# Patient Record
Sex: Female | Born: 1965 | Race: White | Hispanic: No | Marital: Married | Smoking: Never smoker
Health system: Southern US, Community
[De-identification: ages and names within clinical notes are randomized; demographics above are authoritative.]

## PROBLEM LIST (undated history)

## (undated) DIAGNOSIS — F329 Major depressive disorder, single episode, unspecified: Secondary | ICD-10-CM

## (undated) DIAGNOSIS — T8859XA Other complications of anesthesia, initial encounter: Secondary | ICD-10-CM

## (undated) DIAGNOSIS — Z1239 Encounter for other screening for malignant neoplasm of breast: Secondary | ICD-10-CM

## (undated) DIAGNOSIS — N6019 Diffuse cystic mastopathy of unspecified breast: Secondary | ICD-10-CM

## (undated) DIAGNOSIS — E669 Obesity, unspecified: Secondary | ICD-10-CM

## (undated) DIAGNOSIS — Z9889 Other specified postprocedural states: Secondary | ICD-10-CM

## (undated) DIAGNOSIS — R112 Nausea with vomiting, unspecified: Secondary | ICD-10-CM

## (undated) DIAGNOSIS — K8020 Calculus of gallbladder without cholecystitis without obstruction: Secondary | ICD-10-CM

## (undated) DIAGNOSIS — T4145XA Adverse effect of unspecified anesthetic, initial encounter: Secondary | ICD-10-CM

## (undated) DIAGNOSIS — Z1389 Encounter for screening for other disorder: Secondary | ICD-10-CM

## (undated) HISTORY — DX: Encounter for other screening for malignant neoplasm of breast: Z12.39

## (undated) HISTORY — PX: BREAST BIOPSY: SHX20

## (undated) HISTORY — DX: Diffuse cystic mastopathy of unspecified breast: N60.19

## (undated) HISTORY — DX: Obesity, unspecified: E66.9

## (undated) HISTORY — DX: Encounter for screening for other disorder: Z13.89

## (undated) HISTORY — PX: BREAST MASS EXCISION: SHX1267

## (undated) HISTORY — DX: Major depressive disorder, single episode, unspecified: F32.9

## (undated) HISTORY — DX: Calculus of gallbladder without cholecystitis without obstruction: K80.20

---

## 1999-05-19 HISTORY — PX: TUBAL LIGATION: SHX77

## 2003-05-19 DIAGNOSIS — F32A Depression, unspecified: Secondary | ICD-10-CM

## 2003-05-19 HISTORY — DX: Depression, unspecified: F32.A

## 2004-12-16 ENCOUNTER — Ambulatory Visit: Payer: Self-pay | Admitting: General Surgery

## 2005-01-06 ENCOUNTER — Ambulatory Visit: Payer: Self-pay | Admitting: General Surgery

## 2005-04-20 ENCOUNTER — Ambulatory Visit: Payer: Self-pay | Admitting: General Surgery

## 2005-05-18 HISTORY — PX: BREAST MASS EXCISION: SHX1267

## 2005-05-29 ENCOUNTER — Ambulatory Visit: Payer: Self-pay | Admitting: General Surgery

## 2005-12-21 ENCOUNTER — Ambulatory Visit: Payer: Self-pay | Admitting: General Surgery

## 2007-05-30 ENCOUNTER — Ambulatory Visit: Payer: Self-pay | Admitting: General Surgery

## 2008-05-31 ENCOUNTER — Ambulatory Visit: Payer: Self-pay | Admitting: General Surgery

## 2009-07-31 ENCOUNTER — Ambulatory Visit: Payer: Self-pay | Admitting: General Surgery

## 2010-08-05 ENCOUNTER — Ambulatory Visit: Payer: Self-pay | Admitting: General Surgery

## 2011-09-02 ENCOUNTER — Ambulatory Visit: Payer: Self-pay | Admitting: General Surgery

## 2011-09-14 ENCOUNTER — Ambulatory Visit: Payer: Self-pay | Admitting: General Surgery

## 2012-02-17 ENCOUNTER — Ambulatory Visit: Payer: Self-pay | Admitting: Unknown Physician Specialty

## 2012-03-17 ENCOUNTER — Ambulatory Visit: Payer: Self-pay | Admitting: General Surgery

## 2012-05-18 HISTORY — PX: CHOLECYSTECTOMY: SHX55

## 2012-07-08 ENCOUNTER — Encounter: Payer: Self-pay | Admitting: General Surgery

## 2012-08-12 ENCOUNTER — Other Ambulatory Visit: Payer: Self-pay | Admitting: General Surgery

## 2012-08-12 ENCOUNTER — Ambulatory Visit (INDEPENDENT_AMBULATORY_CARE_PROVIDER_SITE_OTHER): Payer: No Typology Code available for payment source | Admitting: General Surgery

## 2012-08-12 ENCOUNTER — Encounter: Payer: Self-pay | Admitting: General Surgery

## 2012-08-12 ENCOUNTER — Ambulatory Visit: Payer: Self-pay | Admitting: Unknown Physician Specialty

## 2012-08-12 DIAGNOSIS — R945 Abnormal results of liver function studies: Secondary | ICD-10-CM

## 2012-08-12 DIAGNOSIS — R7989 Other specified abnormal findings of blood chemistry: Secondary | ICD-10-CM

## 2012-08-12 DIAGNOSIS — K8020 Calculus of gallbladder without cholecystitis without obstruction: Secondary | ICD-10-CM

## 2012-08-12 HISTORY — DX: Calculus of gallbladder without cholecystitis without obstruction: K80.20

## 2012-08-12 NOTE — Progress Notes (Signed)
Patient ID: Mary Morris, female   DOB: 1965-08-11, 47 y.o.   MRN: [id number]  Chief Complaint  Patient presents with  . Abdominal Pain  . Cholelithiasis    HPI Mary Morris is a 47 y.o. female was seen urgently at the request of Amedeo Kinsman, RN-C. in regards to cholelithiasis and abnormal liver function studies the patient had been noted to have fluctuating liver function studies with transaminases in the 200s as early as 3 years ago. Previous evaluation with ultrasound in October 2013 was negative. The patient has lost 35 pounds by dietary modification and activity changes since that time. She has had episodic pain dating back to December 2013 of increasing frequency. A recent ultrasound showed evidence of cholelithiasis. Her serum transaminases are now in the 500s, and the patient was seen for possible cholecystectomy liver biopsy.  Abdominal Pain This is a recurrent problem. The current episode started more than 1 month ago. The onset quality is sudden. The problem occurs every several days. The problem has been gradually worsening. The pain is located in the epigastric region. The pain is severe. The quality of the pain is colicky and a sensation of fullness. The abdominal pain does not radiate. Associated symptoms include nausea. Pertinent negatives include no anorexia, constipation, diarrhea, fever or vomiting. The pain is aggravated by eating. The pain is relieved by nothing. She has tried antacids for the symptoms. The treatment provided moderate relief. Prior diagnostic workup includes ultrasound. Her past medical history is significant for abdominal surgery. C-section    Past Medical History  Diagnosis Date  . Diffuse cystic mastopathy   . Breast screening, unspecified   . Depression 2005  . Obesity, unspecified   . Screening for obesity   . Gallstone 08-12-12    Past Surgical History  Procedure Laterality Date  . Cesarean section  1998 and 2001  . Breast mass excision Left  around 2008  . Breast mass excision Right 2007  . Tubal ligation  2001    History reviewed. No pertinent family history.  Social History History  Substance Use Topics  . Smoking status: Never Smoker   . Smokeless tobacco: Not on file  . Alcohol Use: No    Allergies no known allergies  No current outpatient prescriptions on file.   No current facility-administered medications for this visit.    Review of Systems Review of Systems  Constitutional: Negative for fever.  HENT: Negative.   Eyes: Negative.   Respiratory: Negative.   Cardiovascular: Negative.   Gastrointestinal: Positive for nausea and abdominal pain. Negative for vomiting, diarrhea, constipation, abdominal distention and anorexia.  Endocrine: Negative.   Genitourinary: Negative.   Hematological: Negative.   Psychiatric/Behavioral: Negative.     There were no vitals taken for this visit.  Physical Exam Physical Exam  Constitutional: She is oriented to person, place, and time. She appears well-developed and well-nourished.  HENT:  Head: Normocephalic.  Neck: Normal range of motion. No thyromegaly present.  Cardiovascular: Normal rate, regular rhythm, normal heart sounds and intact distal pulses.   Pulmonary/Chest: Effort normal and breath sounds normal.  Abdominal: She exhibits no distension and no mass. There is no tenderness. There is no rebound and no guarding.  Musculoskeletal: Normal range of motion.  Lymphadenopathy:    She has no cervical adenopathy.  Neurological: She is alert and oriented to person, place, and time. She has normal reflexes.  Skin: Skin is warm.  Psychiatric: She has a normal mood and affect. Her behavior is normal. Judgment  and thought content normal.    Data Reviewed Abdominal ultrasound completed 08/11/2012 showed numerous gallstones. No gallbladder wall thickening, pericholecystic fluid or sonographic Murphy sign. Normal common bile duct.  HIDA scan today showed  nonvisualization of the gallbladder consistent with chronic cholecystitis.  Liver function studies earlier this week showed a rise in her transaminases from the previous baseline of 202 the near 500 level. Normal bilirubin. Normal total protein. Normal albumin. CBC normal.  Assessment    Elevated liver enzymes, symptomatic cholelithiasis.    Plan    Cholecystectomy with liver biopsy is scheduled for 08/15/2012. The patient was given a prescription for Ultram 50 mg tablets, #40 with the inscription 1-2 by mouth every 4 hours when necessary for pain as well as Phenergan 25 mg tablets, #20 with the inscription one by mouth every 4 hours when necessary for nausea. She was instructed to make use of these at the first sensation of her recurrent bout of upper abdominal pain.  The risks and benefits of elective surgery were reviewed including those related to bleeding, infection and injury to the biliary tree.  I anticipate she'll be able to return to work in 7-10 days post procedure.       [name] 08/12/2012, 3:22 PM

## 2012-08-15 ENCOUNTER — Encounter: Payer: Self-pay | Admitting: *Deleted

## 2012-08-15 ENCOUNTER — Ambulatory Visit: Payer: Self-pay | Admitting: General Surgery

## 2012-08-15 DIAGNOSIS — R748 Abnormal levels of other serum enzymes: Secondary | ICD-10-CM

## 2012-08-15 DIAGNOSIS — K8010 Calculus of gallbladder with chronic cholecystitis without obstruction: Secondary | ICD-10-CM

## 2012-08-16 ENCOUNTER — Encounter: Payer: Self-pay | Admitting: General Surgery

## 2012-08-17 LAB — PATHOLOGY REPORT

## 2012-08-18 ENCOUNTER — Telehealth: Payer: Self-pay | Admitting: *Deleted

## 2012-08-18 ENCOUNTER — Encounter: Payer: Self-pay | Admitting: General Surgery

## 2012-08-18 NOTE — Telephone Encounter (Signed)
Message copied by Currie Paris on Thu Aug 18, 2012  8:13 AM ------      Message from: [place], [place] W      Created: Wed Aug 17, 2012  9:44 PM       [name]:Please notify the patient that the liver biopsy pathology showed a resolving inflammatory process without any evidence of malignancy or fat deposition. A copy will be forwarded to Dr. Earnest Conroy office. We'll review in detail at her postoperative visit.      [name]: please send a copy of the August 15, 2012 pathology report to Dr. Earnest Conroy office. Thank you ------

## 2012-08-18 NOTE — Progress Notes (Signed)
Quick Note:  The pathology report has been reviewed. The liver biopsy shows evidence of a resolving hepatitic process. No evidence of fatty infiltration. No evidence of cholestasis or steatosis. No evidence of portal or periductal fibrosis. No stable iron. Differential diagnoses of nonspecific findings suggest adverse drug reaction (? Herbals) intra-abdominal inflammatory process or early resolving hepatobiliary process. The gallbladder showed evidence of chronic cholecystitis and cholelithiasis. The patient has been notified by the nurse of the results. ______

## 2012-08-18 NOTE — Telephone Encounter (Signed)
Notified pt as instructed

## 2012-08-24 ENCOUNTER — Encounter: Payer: Self-pay | Admitting: General Surgery

## 2012-08-24 ENCOUNTER — Ambulatory Visit (INDEPENDENT_AMBULATORY_CARE_PROVIDER_SITE_OTHER): Payer: No Typology Code available for payment source | Admitting: General Surgery

## 2012-08-24 VITALS — BP 120/78 | HR 74 | Resp 14 | Ht 59.0 in | Wt 143.0 lb

## 2012-08-24 DIAGNOSIS — K8020 Calculus of gallbladder without cholecystitis without obstruction: Secondary | ICD-10-CM

## 2012-08-24 NOTE — Patient Instructions (Addendum)
Patient to return as needed. 

## 2012-08-24 NOTE — Progress Notes (Signed)
  Patient ID: Mary Morris, female   DOB: 27-Jan-1966, 47 y.o.   MRN: [id number]  Chief Complaint  Patient presents with  . Routine Post Op    gallbladder    HPI [name] is a 47 y.o. female here for her post op gallbladder. Patient states no new problems.  The patient underwent cholecystectomy and liver biopsy on 08/15/2012. She had been noted to have rising liver function studies as well as abdominal pain hardest surgery. The latter has resolved completely HPI  Past Medical History  Diagnosis Date  . Diffuse cystic mastopathy   . Breast screening, unspecified   . Depression 2005  . Obesity, unspecified   . Screening for obesity   . Gallstone 08-12-12    Past Surgical History  Procedure Laterality Date  . Cesarean section  1998 and 2001  . Breast mass excision Left around 2008  . Breast mass excision Right 2007  . Tubal ligation  2001  . Cholecystectomy  2014    No family history on file.  Social History History  Substance Use Topics  . Smoking status: Never Smoker   . Smokeless tobacco: Not on file  . Alcohol Use: No    No Known Allergies  No current outpatient prescriptions on file.   No current facility-administered medications for this visit.    Review of Systems Review of Systems  Constitutional: Negative.   Respiratory: Negative.   Cardiovascular: Negative.   Gastrointestinal: Negative.     Blood pressure 120/78, pulse 74, resp. rate 14, height 4\' 11"  (1.499 m), weight 143 lb (64.864 kg), last menstrual period 08/15/2012.  Physical Exam Physical Exam  Constitutional: She appears well-developed and well-nourished.  Neck: Normal range of motion. Neck supple.  Cardiovascular: Normal rate, regular rhythm and normal heart sounds.   Pulmonary/Chest: Effort normal and breath sounds normal.  Abdominal: Soft. Bowel sounds are normal.    Data Reviewed Pathology showed chronic cholecystitis and cholelithiasis.  Liver biopsy showed evidence of a  resolving hepatitic process. There was no cholestasis or steatosis. No further significant inflammation or evidence of bile duct damage was identified. The findings were felt consistent with a resolving process.  The patient denies use of any herbal medications or nonprescription drugs during her period of weight loss.  Assessment    Doing well post cholecystectomy. Resolution of previous abdominal pain.    Plan    The patient is released to full activity. Follow up on an as-needed basis.  The patient is scheduled to followup with the GI department tomorrow. Repeat laboratory studies will be scheduled through their office.       [name] 08/25/2012, 7:30 PM

## 2012-08-25 ENCOUNTER — Encounter: Payer: Self-pay | Admitting: General Surgery

## 2012-09-21 ENCOUNTER — Encounter: Payer: Self-pay | Admitting: General Surgery

## 2012-09-26 ENCOUNTER — Ambulatory Visit: Payer: Self-pay | Admitting: General Surgery

## 2012-09-28 ENCOUNTER — Ambulatory Visit: Payer: Self-pay | Admitting: General Surgery

## 2012-09-29 ENCOUNTER — Encounter: Payer: Self-pay | Admitting: General Surgery

## 2012-10-06 ENCOUNTER — Encounter: Payer: Self-pay | Admitting: General Surgery

## 2012-10-06 ENCOUNTER — Ambulatory Visit (INDEPENDENT_AMBULATORY_CARE_PROVIDER_SITE_OTHER): Payer: No Typology Code available for payment source | Admitting: General Surgery

## 2012-10-06 ENCOUNTER — Other Ambulatory Visit: Payer: Self-pay | Admitting: *Deleted

## 2012-10-06 VITALS — BP 124/72 | HR 74 | Resp 12 | Ht 59.0 in | Wt 144.0 lb

## 2012-10-06 DIAGNOSIS — Z1239 Encounter for other screening for malignant neoplasm of breast: Secondary | ICD-10-CM

## 2012-10-06 DIAGNOSIS — Z1231 Encounter for screening mammogram for malignant neoplasm of breast: Secondary | ICD-10-CM

## 2012-10-06 DIAGNOSIS — N6019 Diffuse cystic mastopathy of unspecified breast: Secondary | ICD-10-CM

## 2012-10-06 NOTE — Progress Notes (Signed)
Patient will be asked to return to the office in one year for a bilateral screening mammogram. 

## 2012-10-06 NOTE — Patient Instructions (Addendum)
To follow up in 1 year with bilateral screening mammogram. Advised again on self exam.

## 2012-10-06 NOTE — Progress Notes (Signed)
Patient ID: Mary Morris, female   DOB: 10/27/1965, 47 y.o.   MRN: [id number]  Chief Complaint  Patient presents with  . Follow-up    mammogram    HPI Mary Morris is a 47 y.o. female here today following up from her mammogram done on 09/28/12 with birad category 1. Patient does not perform self breast checks but gets regular mammograms. The patient has had bilateral breast mass excisions performed in the past. No known family history of breast problems. No complaints at this time. Since last seen 1 yr ago she has had lap/chole and has done well since. HPI  Past Medical History  Diagnosis Date  . Diffuse cystic mastopathy   . Breast screening, unspecified   . Depression 2005  . Obesity, unspecified   . Screening for obesity   . Gallstone 08-12-12    Past Surgical History  Procedure Laterality Date  . Cesarean section  1998 and 2001  . Breast mass excision Left around 2008  . Breast mass excision Right 2007  . Tubal ligation  2001  . Cholecystectomy  2014    History reviewed. No pertinent family history.  Social History History  Substance Use Topics  . Smoking status: Never Smoker   . Smokeless tobacco: Never Used  . Alcohol Use: No    No Known Allergies  No current outpatient prescriptions on file.   No current facility-administered medications for this visit.    Review of Systems Review of Systems  Constitutional: Negative.   Respiratory: Negative.   Cardiovascular: Negative.     Blood pressure 124/72, pulse 74, resp. rate 12, height 4\' 11"  (1.499 m), weight 144 lb (65.318 kg), last menstrual period 09/22/2012.  Physical Exam Physical Exam  Constitutional: She appears well-developed and well-nourished.  Eyes: Conjunctivae are normal. No scleral icterus.  Neck: No mass and no thyromegaly present.  Cardiovascular: Normal rate, regular rhythm, normal heart sounds and normal pulses.   No murmur heard. Pulmonary/Chest: Effort normal and breath sounds normal.  Right breast exhibits no inverted nipple, no mass, no nipple discharge, no skin change and no tenderness. Left breast exhibits no inverted nipple, no mass, no nipple discharge, no skin change and no tenderness. Breasts are symmetrical.  Lymphadenopathy:    She has no cervical adenopathy.    She has no axillary adenopathy.    Data Reviewed Mammogram reviewed with birad 1.   Assessments    Exam stable.      Plan    1 year follow up bilateral screening mammogram.         Clancey Welton G 10/06/2012, 8:04 PM

## 2013-07-20 ENCOUNTER — Ambulatory Visit: Payer: Self-pay

## 2013-10-03 ENCOUNTER — Ambulatory Visit: Payer: Self-pay | Admitting: General Surgery

## 2013-10-04 ENCOUNTER — Encounter: Payer: Self-pay | Admitting: General Surgery

## 2013-10-10 ENCOUNTER — Ambulatory Visit (INDEPENDENT_AMBULATORY_CARE_PROVIDER_SITE_OTHER): Payer: No Typology Code available for payment source | Admitting: General Surgery

## 2013-10-10 ENCOUNTER — Encounter: Payer: Self-pay | Admitting: General Surgery

## 2013-10-10 VITALS — BP 120/74 | HR 76 | Resp 12 | Ht 59.0 in | Wt 169.0 lb

## 2013-10-10 DIAGNOSIS — Z1231 Encounter for screening mammogram for malignant neoplasm of breast: Secondary | ICD-10-CM

## 2013-10-10 NOTE — Progress Notes (Signed)
Patient ID: Mary Morris, female   DOB: 02/16/1966, 48 y.o.   MRN: [id number]  Chief Complaint  Patient presents with  . Follow-up    mammogram    HPI Mary Morris is a 48 y.o. female who presents for a breast evaluation. The most recent mammogram was done on 10/03/13.  Patient does perform regular self breast checks and gets regular mammograms done.  No complaints.  HPI  Past Medical History  Diagnosis Date  . Diffuse cystic mastopathy   . Breast screening, unspecified   . Depression 2005  . Obesity, unspecified   . Screening for obesity   . Gallstone 08-12-12    Past Surgical History  Procedure Laterality Date  . Cesarean section  1998 and 2001  . Breast mass excision Left around 2008  . Breast mass excision Right 2007  . Tubal ligation  2001  . Cholecystectomy  2014    History reviewed. No pertinent family history.  Social History History  Substance Use Topics  . Smoking status: Never Smoker   . Smokeless tobacco: Never Used  . Alcohol Use: No    No Known Allergies  No current outpatient prescriptions on file.   No current facility-administered medications for this visit.    Review of Systems Review of Systems  Constitutional: Negative.   Respiratory: Negative.   Cardiovascular: Negative.     Blood pressure 120/74, pulse 76, resp. rate 12, height 4\' 11"  (1.499 m), weight 169 lb (76.658 kg), last menstrual period 09/26/2013.  Physical Exam Physical Exam  Constitutional: She is oriented to person, place, and time. She appears well-developed and well-nourished.  Eyes: Conjunctivae are normal. No scleral icterus.  Neck: Neck supple. No mass and no thyromegaly present.  Cardiovascular: Normal rate, regular rhythm and normal heart sounds.   Pulmonary/Chest: Effort normal and breath sounds normal. Right breast exhibits no inverted nipple, no mass, no nipple discharge, no skin change and no tenderness. Left breast exhibits no inverted nipple, no mass, no nipple  discharge, no skin change and no tenderness.   Breast   well healed incisions..   Abdominal: Soft. Bowel sounds are normal. There is no tenderness.  Lymphadenopathy:    She has no cervical adenopathy.    She has no axillary adenopathy.  Neurological: She is oriented to person, place, and time.  Skin: Skin is warm and dry.    Data Reviewed Mammogram reviewed  Assessment    Stable exam. History of FCD    Plan    Patient will be asked to return to the office in one year with a bilateral screening mammogram.       Tiffney Haughton G Yi Haugan 10/11/2013, 6:24 AM

## 2013-10-10 NOTE — Patient Instructions (Addendum)
Patient will be asked to return to the office in one year with a bilateral screening mammogram.  Continue self breast exams. Call office for any new breast issues or concerns.  

## 2013-10-11 ENCOUNTER — Encounter: Payer: Self-pay | Admitting: General Surgery

## 2014-03-19 ENCOUNTER — Encounter: Payer: Self-pay | Admitting: General Surgery

## 2014-03-29 IMAGING — NM NUCLEAR MEDICINE HEPATOHBILIARY INCLUDE GB
1 series · 10 of 10 positions shown · non-contrast
Comparison: none

REASON FOR EXAM: epigastric abd pain elev alkaline phosphatase elev
transaminase If neg do Hi...
COMMENTS:  Change per Dr. Hyrum @ [DATE] ..... gave verbal order ....
change due to patient having gallstones

[Series 1000: gallbladder statics · 4.80mm/px · 10 of 10 slices shown]
[im 1/10]
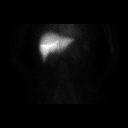
[im 2/10]
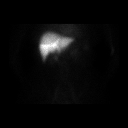
[im 3/10]
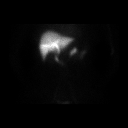
[im 4/10]
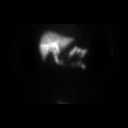
[im 5/10]
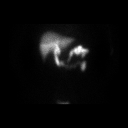
[im 6/10]
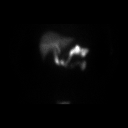
[im 7/10]
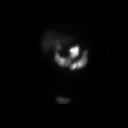
[im 8/10]
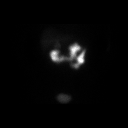
[im 9/10]
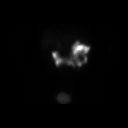
[im 10/10]
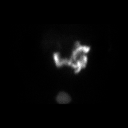

[10 of 10 positions shown; findings below may reference images not displayed]

PROCEDURE:     NM  - NM HEPATOBILIARY IMAGE  - August 12, 2012 [DATE]

RESULT:     The patient received a dose of 7.73 mCi of technetium 99m
Choletec. There is prompt extraction of tracer from the blood pool by the
liver with common bile duct activity seen between 5 and 10 minutes with
small bowel activity well seen by 10 minutes with clearance of activity from
the liver by 60 minutes. No gallbladder localization is evident. The
appearance is concerning for acute cholecystitis or cystic duct obstruction
possibly with chronic cholecystitis. Given the extensive cholelithiasis and
sludge within the gallbladder surgical consultation is recommended. The
patient had a reported negative sonographic Murphy's sign by the
technologist without pericholecystic fluid or definite abnormal gallbladder
wall thickening but given the hepatobiliary findings surgical consultation
is suggested.
IMPRESSION: Nonvisualization of the gallbladder concern for acute
cholecystitis, cystic duct obstruction or possibly chronic cholecystitis.

[REDACTED]

## 2014-03-29 IMAGING — US ABDOMEN ULTRASOUND
1 series · 14 of 25 positions shown · non-contrast
Comparison: none

REASON FOR EXAM: epigastric abd pain elev alkaline phosphatase elev
transaminase If neg do Hi...
COMMENTS:

[Series 2001: abdomen ultrasound · 0.20mm/px · 14 of 96 slices shown]
[im 1/96]
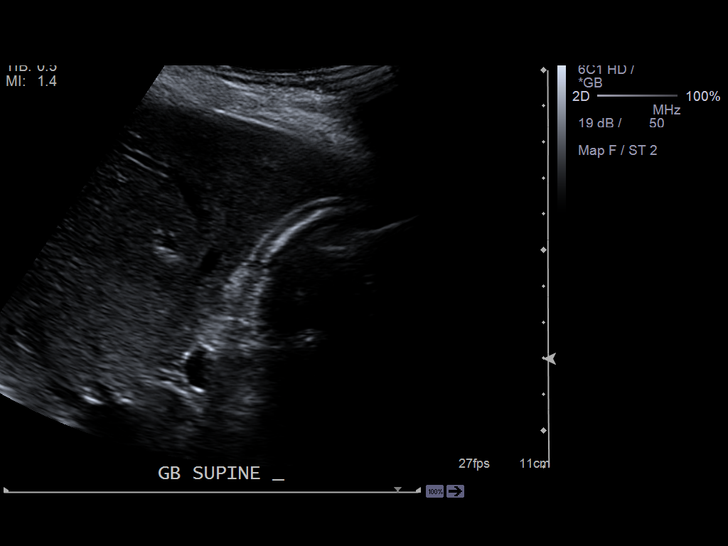
[im 8/96]
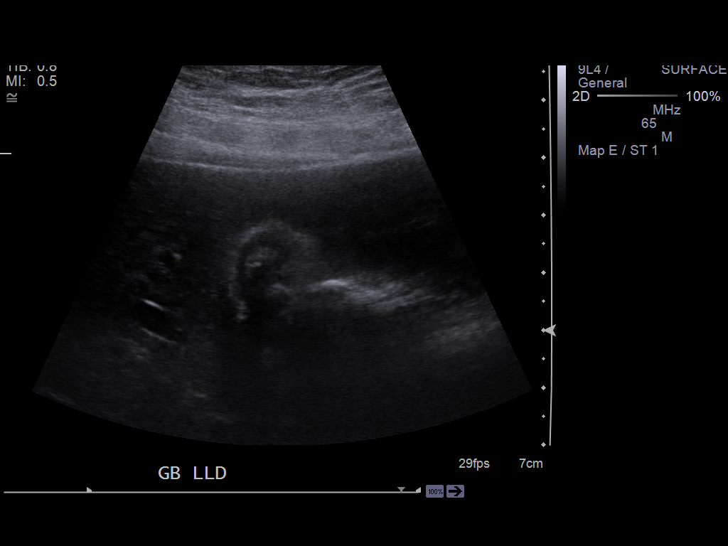
[im 16/96]
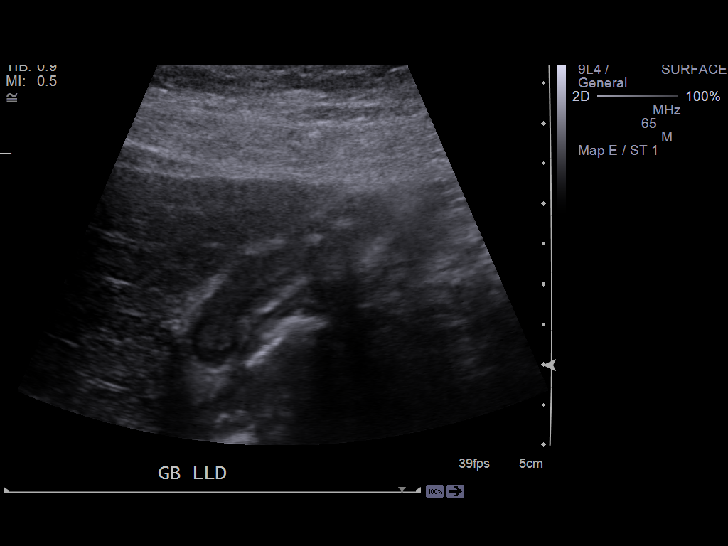
[im 24/96]
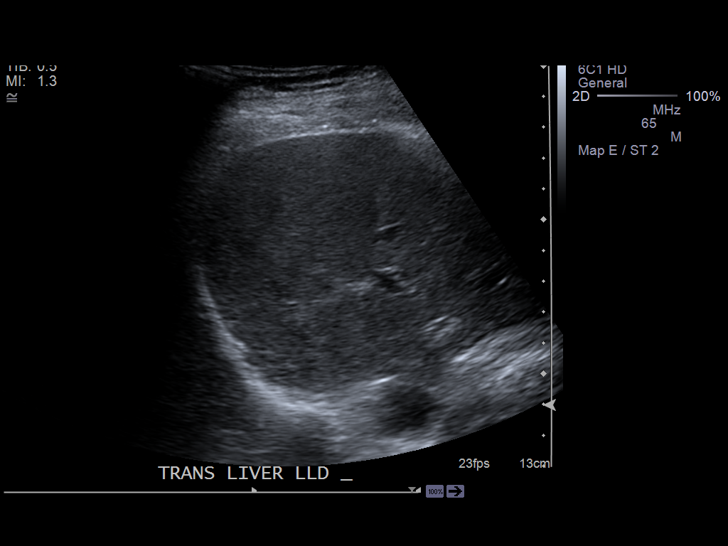
[im 32/96]
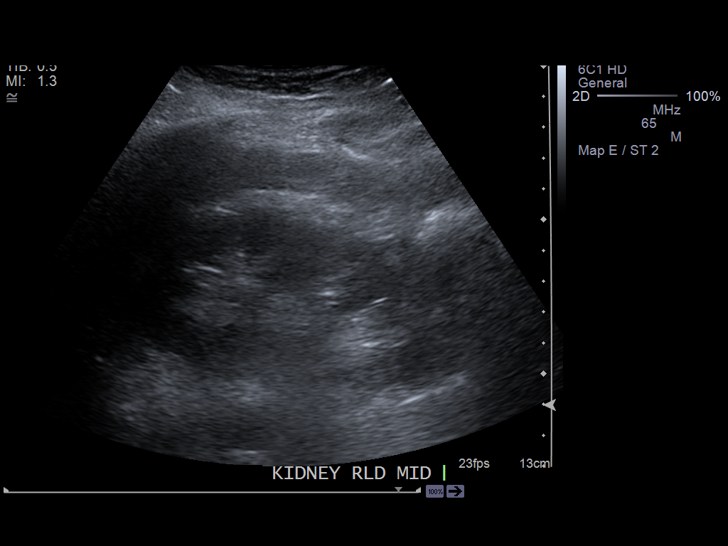
[im 36/96]
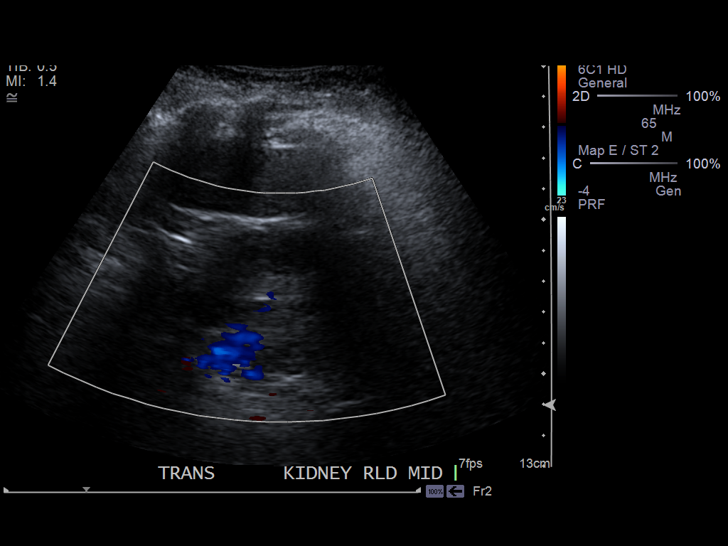
[im 44/96]
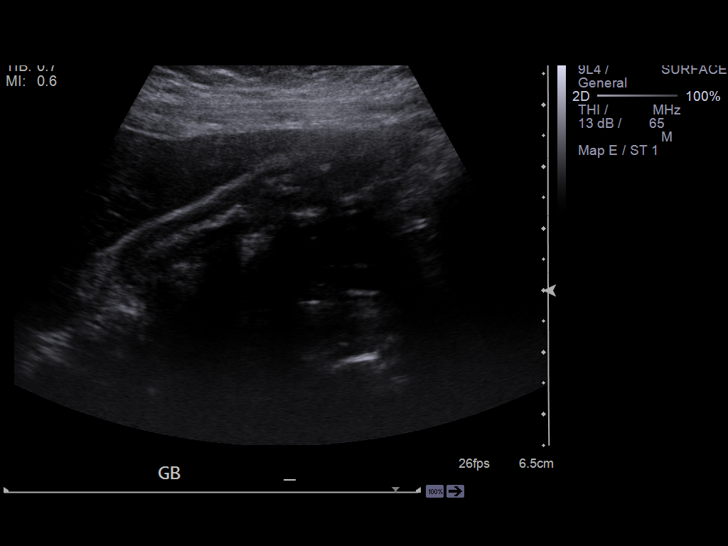
[im 52/96]
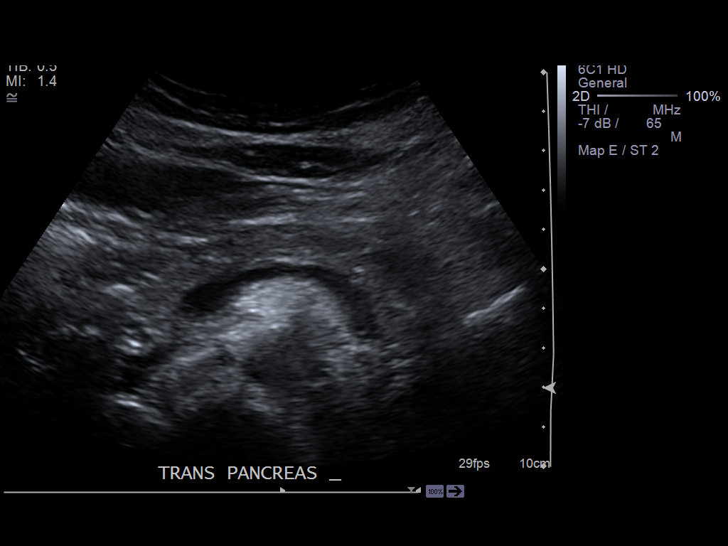
[im 60/96]
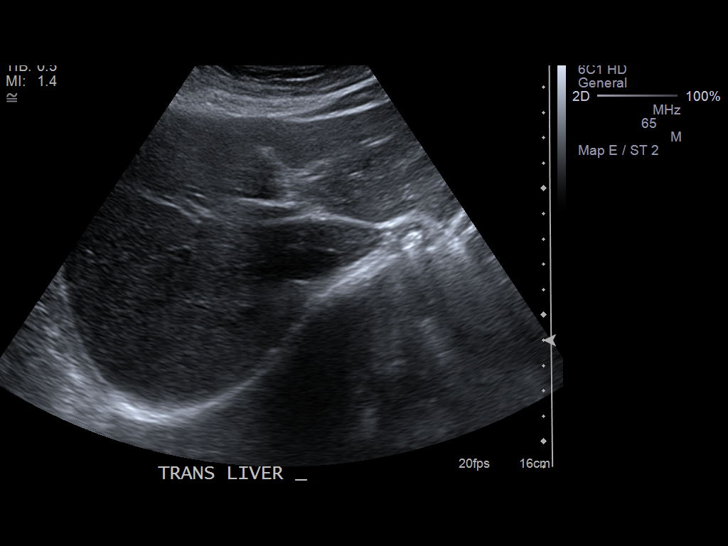
[im 64/96]
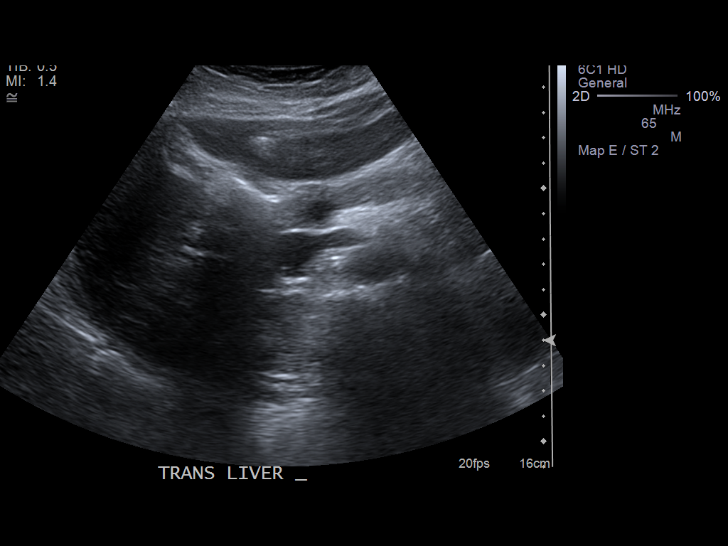
[im 72/96]
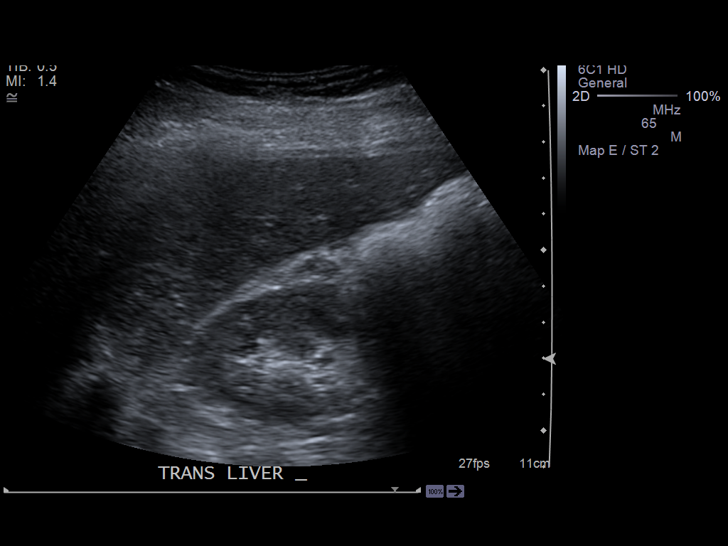
[im 80/96]
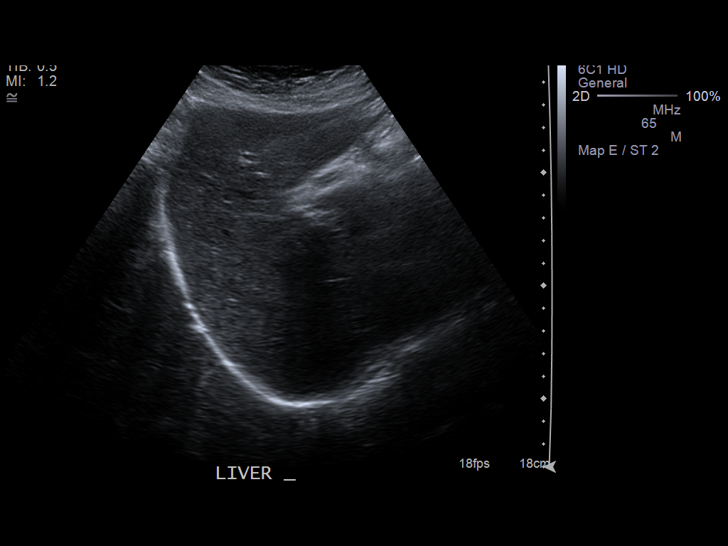
[im 88/96]
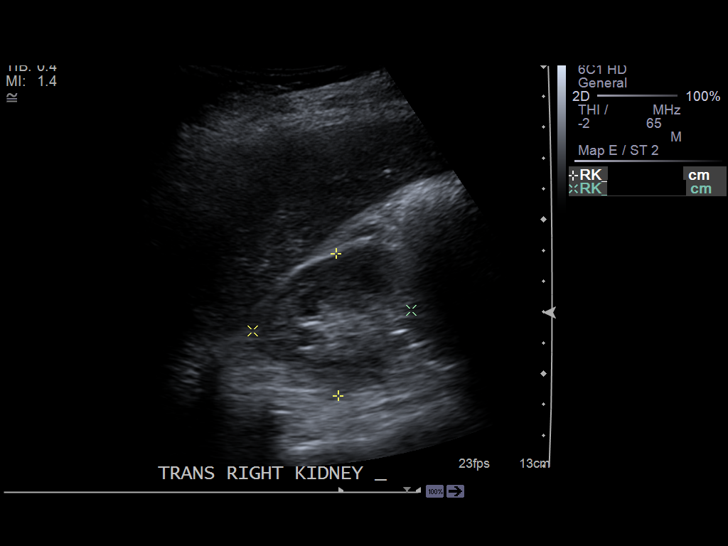
[im 96/96]
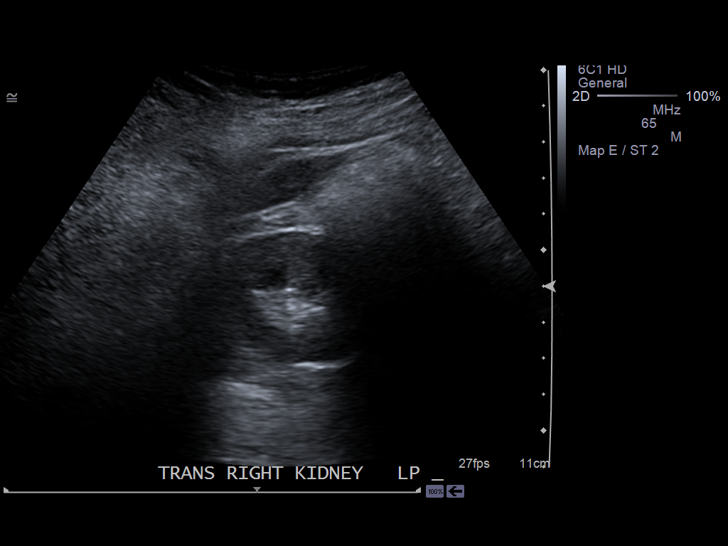

[14 of 25 positions shown; findings below may reference images not displayed]

PROCEDURE:     US  - US ABDOMEN GENERAL SURVEY  - August 12, 2012 [DATE]

RESULT:     Abdominal sonogram is performed in the standard fashion. The
patient has a previous abdominal ultrasound dated to February 2012. There
appears to be with filling of gallbladder with sludge and stones. The stones
appear to be nonmobile. There is a negative sonographic Murphy's sign.
Gallbladder wall thickness is 1.4 mm. Common bile duct diameter 3.6 mm.
Liver length is 12.59 cm with a normal hepatic echotexture. The pancreas and
spleen appear grossly normal. Length of the spleen is 9.56 cm. Proximal
inferior vena cava and the visualized abdominal aorta appear normal. The
right kidney measures 11.14 x 4.61 x 5.19 cm. The left kidney measures
x 5.44 x 5.19 cm. Neither kidney shows obstruction or mass. No focal
pancreatic mass or ductal dilation is evident in the visualized portions.
IMPRESSION: Sludge and stones in the gallbladder without sonographic
evidence of acute cholecystitis. Otherwise, unremarkable abdominal sonogram.

[REDACTED]

## 2014-04-01 IMAGING — XA DG CHOLANGIOGRAM OPERATIVE
2 series · 3 of 3 positions shown · non-contrast
Comparison: none

REASON FOR EXAM: Cholelithiasis
COMMENTS:

[Series 6001: by1 · 1 of 1 slices shown]
[im 1/1]
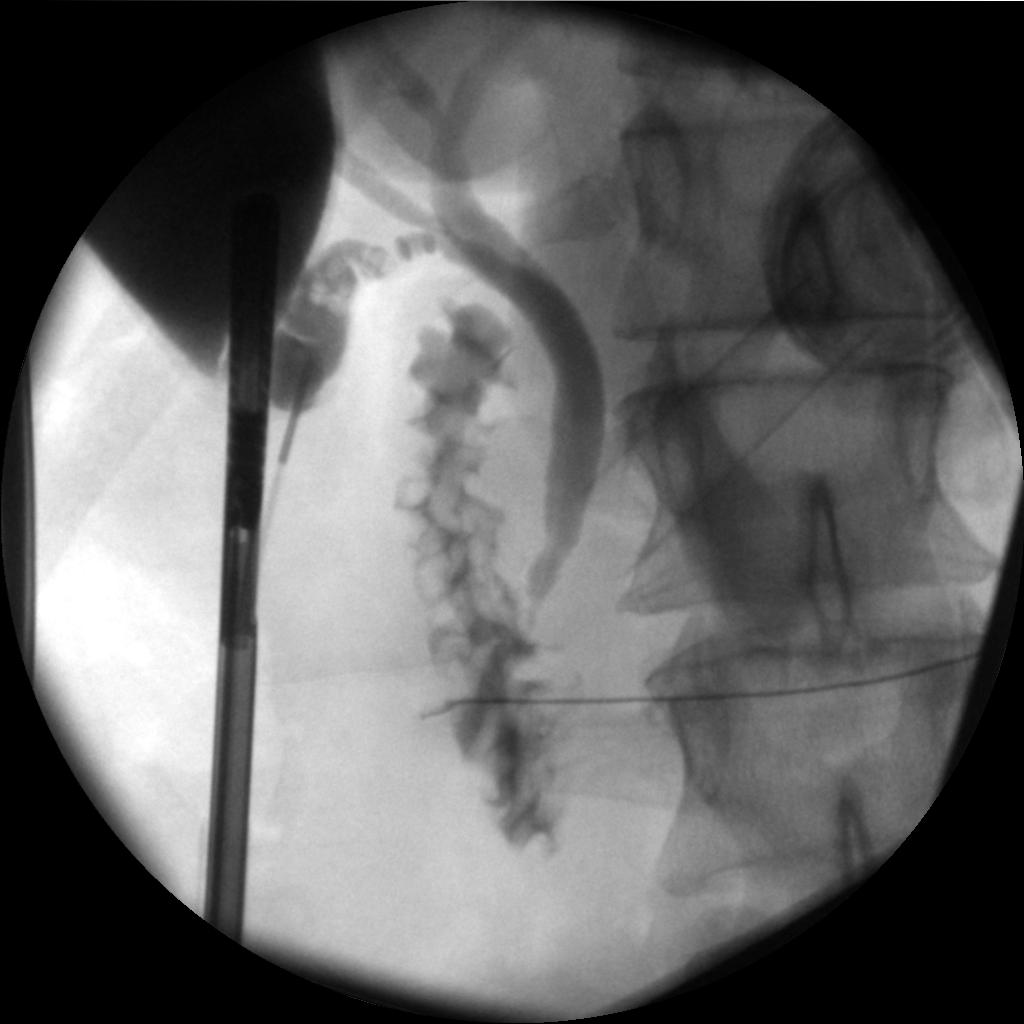

[Series 6002: by2 · 2 of 2 slices shown]
[im 1/2]
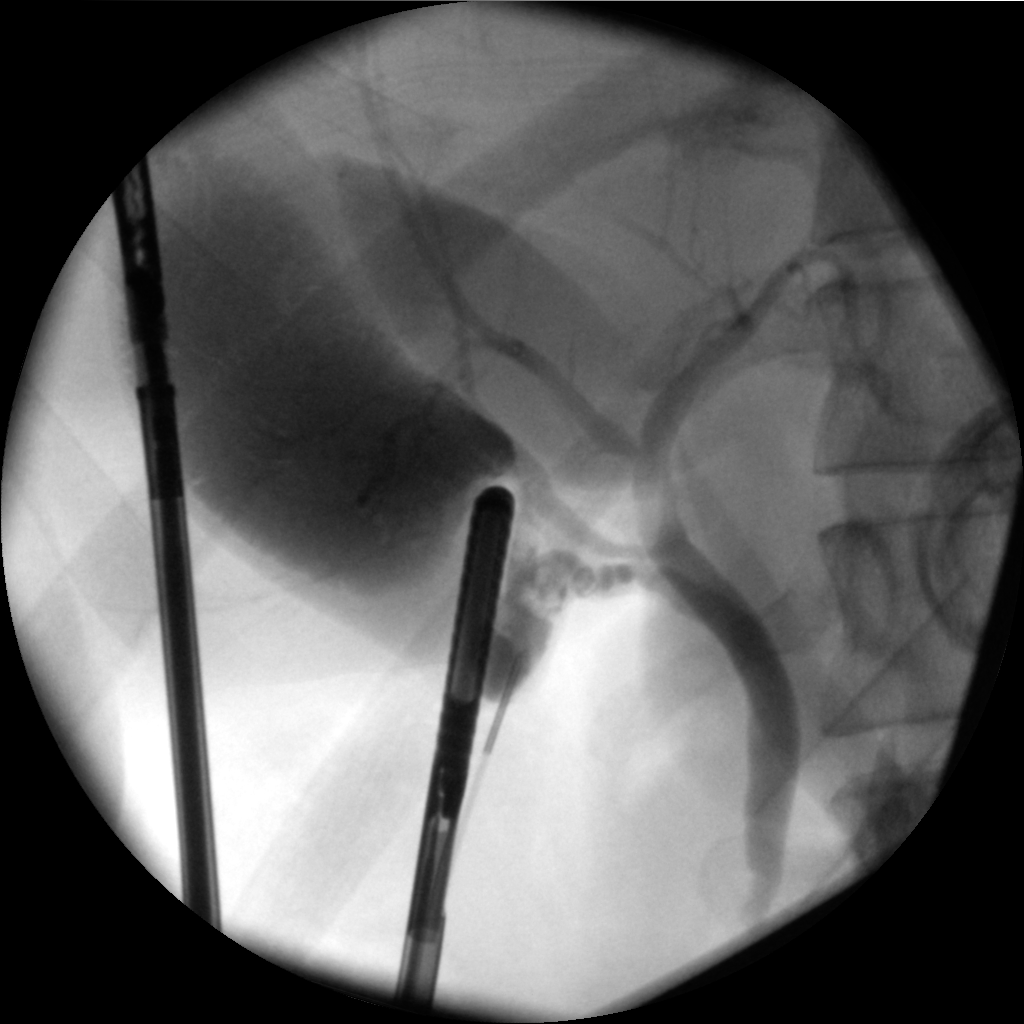
[im 2/2]
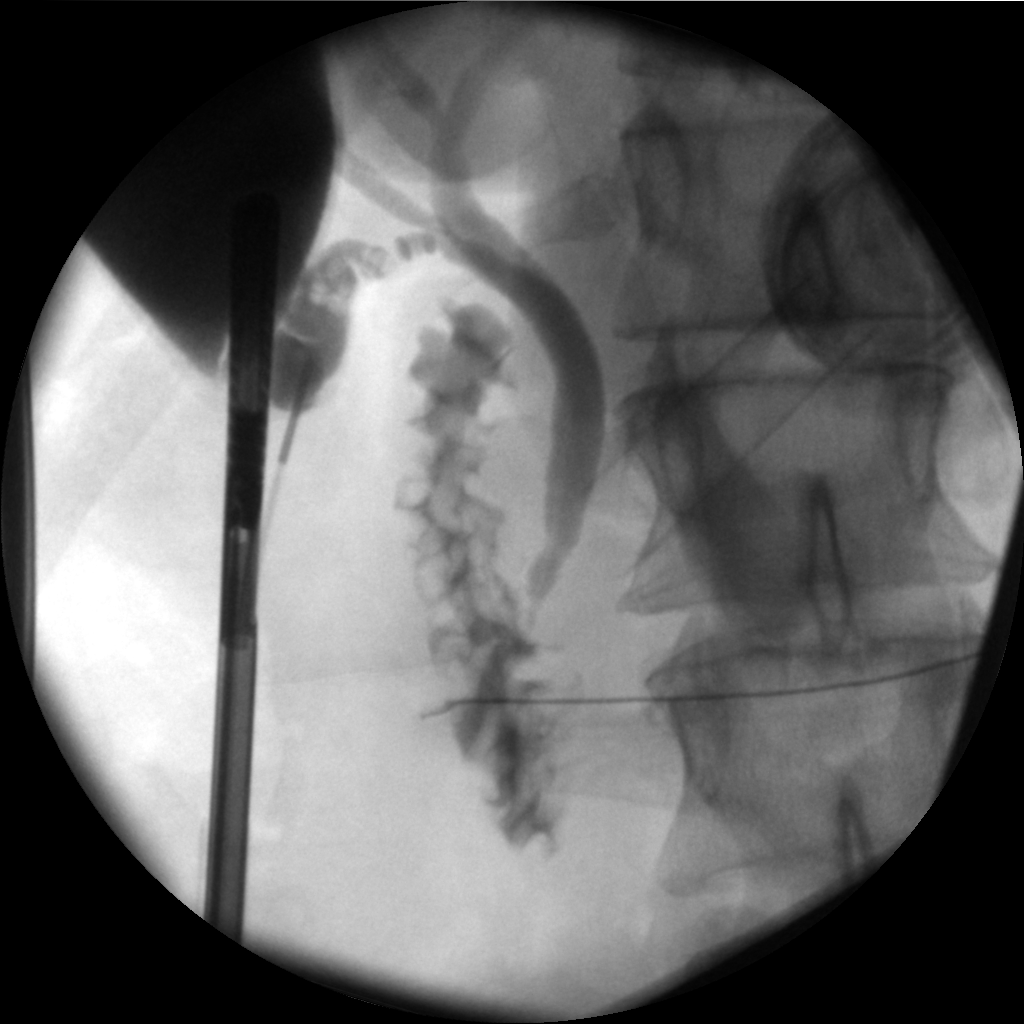

[3 of 3 positions shown; findings below may reference images not displayed]

PROCEDURE:     DXR - DXR CHOLANGIOGRAM OP (INITIAL)  - August 15, 2012  [DATE]

RESULT:     There is opacification of the common hepatic duct, cystic duct
and common bile duct without filling defect or obstructive change. Contrast
flows into the small bowel. There are some filling defects evident the
gallbladder.
IMPRESSION: Please see above.

[REDACTED]

## 2014-08-30 ENCOUNTER — Other Ambulatory Visit: Payer: Self-pay

## 2014-08-30 DIAGNOSIS — Z1231 Encounter for screening mammogram for malignant neoplasm of breast: Secondary | ICD-10-CM

## 2014-09-07 NOTE — Op Note (Signed)
PATIENT NAME:  Mary Morris, Mary Morris MR#:  [id number] DATE OF BIRTH:  [birth year]  DATE OF PROCEDURE:  08/15/2012  PREOPERATIVE DIAGNOSES:  1.  Abnormal liver function studies.  2.  Cholelithiasis with suspicion for cholecystitis.   POSTOPERATIVE DIAGNOSES:  1.  Abnormal liver function studies. 2.  Cholelithiasis with suspicion for cholecystitis.   OPERATIVE PROCEDURE: Laparoscopic cholecystectomy with intraoperative cholangiograms, percutaneous liver biopsy.   SURGEON: Donnalee Curry, MD   ANESTHESIA: General endotracheal.   ANESTHESIOLOGIST:  Dr. Dimple Casey.   ESTIMATED BLOOD LOSS: 50 mL.   CLINICAL NOTE: This 49 year old woman has a 3-year history of modest elevation of the serum transaminases which recently became more pronounced. In October 13, 2013ultrasound of the gallbladder was negative. More recently, she has had episodic pain; and a repeat ultrasound showed evidence of cholelithiasis with a normal common bile duct. Transaminases are now in the 500s with a normal bilirubin. She was felt to be a candidate for elective cholecystectomy as well as liver biopsy.   OPERATIVE NOTE: With the patient under adequate general endotracheal anesthesia, the abdomen was prepped with ChloraPrep and draped. A Veress needle was placed through a transumbilical incision. After assuring intra-abdominal location with the hanging drop test, the abdomen was insufflated with CO2 at 10 mmHg pressure. A 10 mm step port was expanded and inspection showed no evidence of injury from initial port placement. The patient was placed in reverse Trendelenburg position and rolled to the left. An 11 mm Xcel port was placed in the epigastrium and two 5-mm step ports placed laterally. The gallbladder showed minimal changes of chronic inflammation. It was placed on cephalad traction and the neck of the gallbladder cleared. Fluoroscopic cholangiograms were completed using 40 mL of one-half strength Conray 60. There was prompt filling of the  right and left hepatic ducts and free flow into the common bile duct. Initial fluoroscopic images showed no passage of the dye through the ampulla. On continued observation, a burst of dye went through suggesting mild spasm from narcotics received during induction of anesthesia. No unusual ductal dilatation was appreciated. The cystic duct and branches of the cystic artery were doubly clipped and divided. The gallbladder was then removed from the liver bed making use of hook cautery dissection. Due to a small rent in the thin gallbladder wall on the fundus, it was placed into an Endo Catch bag and then delivered through the umbilical port site. After re-establishing pneumoperitoneum, inspection of the lower abdomen showed no evidence of injury from initial port placement. The right upper quadrant was irrigated and good hemostasis noted. An area just below the costal margin lateral to the mid clavicular line was chosen for passage of a 14-gauge Bard biopsy device. Two cores were obtained from the right lobe of the liver. All the bleeding from the procedure was from the liver biopsy site. This was controlled with electrocautery as well as Surgicel application. After direct pressure for 5 minutes, no further bleeding was noted and the small piece of Surgicel left in situ. The right upper quadrant was irrigated of any residual blood. The abdomen was then desufflated and ports removed under direct vision. Skin incisions were closed with 4-0 Vicryl subcuticular suture. Benzoin, Steri-Strips, Telfa and Tegaderm dressings were applied.   The patient tolerated the procedure well and was taken to the recovery room in stable condition.    ____________________________ [name], MD jwb:cb D: 08/15/2012 15:50:59 ET T: 08/15/2012 21:15:30 ET JOB#: [id number]  cc: [name], MD, <Dictator> Loraine Leriche  A. Dossie Arbourrissman, MD Ranae PlumberKimberly A. Arvilla MarketMills, ANP Scot Junobert T. Elliott, MD Gema Ringold Brion AlimentW Charistopher Rumble MD ELECTRONICALLY SIGNED  08/17/2012 11:48

## 2014-10-09 ENCOUNTER — Ambulatory Visit: Payer: Self-pay

## 2014-10-17 ENCOUNTER — Ambulatory Visit: Payer: No Typology Code available for payment source | Admitting: General Surgery

## 2014-10-24 ENCOUNTER — Ambulatory Visit: Payer: No Typology Code available for payment source

## 2014-11-12 ENCOUNTER — Ambulatory Visit: Payer: No Typology Code available for payment source | Admitting: General Surgery

## 2014-11-28 ENCOUNTER — Ambulatory Visit: Payer: No Typology Code available for payment source

## 2014-12-04 ENCOUNTER — Ambulatory Visit
Admission: RE | Admit: 2014-12-04 | Discharge: 2014-12-04 | Disposition: A | Discharge status: Home / Self Care | Payer: No Typology Code available for payment source | Source: Ambulatory Visit | Attending: General Surgery | Admitting: General Surgery

## 2014-12-04 DIAGNOSIS — Z1231 Encounter for screening mammogram for malignant neoplasm of breast: Secondary | ICD-10-CM | POA: Diagnosis present

## 2014-12-05 ENCOUNTER — Ambulatory Visit: Payer: No Typology Code available for payment source | Admitting: General Surgery

## 2014-12-18 ENCOUNTER — Ambulatory Visit (INDEPENDENT_AMBULATORY_CARE_PROVIDER_SITE_OTHER): Payer: No Typology Code available for payment source | Admitting: General Surgery

## 2014-12-18 ENCOUNTER — Encounter: Payer: Self-pay | Admitting: General Surgery

## 2014-12-18 VITALS — BP 124/78 | HR 82 | Resp 14 | Ht 59.0 in | Wt 186.0 lb

## 2014-12-18 DIAGNOSIS — N6019 Diffuse cystic mastopathy of unspecified breast: Secondary | ICD-10-CM

## 2014-12-18 NOTE — Patient Instructions (Addendum)
Continue self breast exams. Call office for any new breast issues or concerns. Patient will be asked to return to the office in one year with a bilateral screening mammogram. 

## 2014-12-18 NOTE — Progress Notes (Signed)
Patient ID: Mary Morris, female   DOB: 1965-12-15, 49 y.o.   MRN: [id number]  Chief Complaint  Patient presents with  . Follow-up    HPI Mary Morris is a 49 y.o. female.  who presents for a breast evaluation. The most recent mammogram was done on 12-04-14.  Patient does perform regular self breast checks and gets regular mammograms done.  No new complaints.  HPI  Past Medical History  Diagnosis Date  . Diffuse cystic mastopathy   . Breast screening, unspecified   . Depression 2005  . Obesity, unspecified   . Screening for obesity   . Gallstone 08-12-12    Past Surgical History  Procedure Laterality Date  . Cesarean section  1998 and 2001  . Breast mass excision Left around 2008  . Breast mass excision Right 2007  . Tubal ligation  2001  . Cholecystectomy  2014  . Breast biopsy Bilateral     1-rt-neg, 2-left- neg    Family History  Problem Relation Age of Onset  . Colon polyps Mother     Social History History  Substance Use Topics  . Smoking status: Never Smoker   . Smokeless tobacco: Never Used  . Alcohol Use: No    No Known Allergies  No current outpatient prescriptions on file.   No current facility-administered medications for this visit.    Review of Systems Review of Systems  Constitutional: Negative.   Respiratory: Negative.   Cardiovascular: Negative.     Blood pressure 124/78, pulse 82, resp. rate 14, height  (1.499 m), weight 186 lb (84.369 kg), last menstrual period 10/06/2014.  Physical Exam Physical Exam  Constitutional: She is oriented to person, place, and time. She appears well-developed and well-nourished.  HENT:  Mouth/Throat: Oropharynx is clear and moist.  Eyes: Conjunctivae are normal. No scleral icterus.  Neck: Neck supple.  Cardiovascular: Normal rate, regular rhythm and normal heart sounds.   Pulmonary/Chest: Effort normal and breath sounds normal. Right breast exhibits no inverted nipple, no mass, no nipple  discharge, no skin change and no tenderness. Left breast exhibits no inverted nipple, no mass, no nipple discharge, no skin change and no tenderness.  Abdominal: Soft. Normal appearance and bowel sounds are normal. There is no hepatomegaly. There is no tenderness.  Lymphadenopathy:    She has no cervical adenopathy.    She has no axillary adenopathy.  Neurological: She is alert and oriented to person, place, and time.  Skin: Skin is warm and dry.  Psychiatric: Her behavior is normal.    Data Reviewed Mammogram reviewed and stable.   Assessment    Stable physical exam. Fibrocystic disease by previous biopsies.     Plan    Patient will be asked to return to the office in one year with a bilateral screening mammogram.     PCP:  No Pcp Per Patient GYN: Cecille Aver 12/18/2014, 8:40 PM

## 2015-09-11 ENCOUNTER — Other Ambulatory Visit: Payer: Self-pay

## 2015-09-11 DIAGNOSIS — Z1231 Encounter for screening mammogram for malignant neoplasm of breast: Secondary | ICD-10-CM

## 2015-11-27 ENCOUNTER — Ambulatory Visit: Payer: No Typology Code available for payment source

## 2015-12-06 ENCOUNTER — Ambulatory Visit: Payer: No Typology Code available for payment source

## 2015-12-09 ENCOUNTER — Ambulatory Visit
Admission: RE | Admit: 2015-12-09 | Discharge: 2015-12-09 | Disposition: A | Discharge status: Home / Self Care | Payer: Managed Care, Other (non HMO) | Source: Ambulatory Visit | Attending: General Surgery | Admitting: General Surgery

## 2015-12-09 ENCOUNTER — Encounter: Payer: Self-pay | Admitting: Radiology

## 2015-12-09 DIAGNOSIS — Z1231 Encounter for screening mammogram for malignant neoplasm of breast: Secondary | ICD-10-CM | POA: Diagnosis present

## 2015-12-10 ENCOUNTER — Encounter: Payer: Self-pay | Admitting: *Deleted

## 2015-12-17 ENCOUNTER — Ambulatory Visit (INDEPENDENT_AMBULATORY_CARE_PROVIDER_SITE_OTHER): Payer: 59 | Admitting: General Surgery

## 2015-12-17 ENCOUNTER — Encounter: Payer: Self-pay | Admitting: General Surgery

## 2015-12-17 ENCOUNTER — Ambulatory Visit: Payer: No Typology Code available for payment source | Admitting: General Surgery

## 2015-12-17 VITALS — BP 132/82 | HR 88 | Resp 14 | Ht 59.0 in | Wt 182.0 lb

## 2015-12-17 DIAGNOSIS — N6019 Diffuse cystic mastopathy of unspecified breast: Secondary | ICD-10-CM

## 2015-12-17 DIAGNOSIS — Z8371 Family history of colonic polyps: Secondary | ICD-10-CM

## 2015-12-17 DIAGNOSIS — Z83719 Family history of colon polyps, unspecified: Secondary | ICD-10-CM

## 2015-12-17 MED ORDER — POLYETHYLENE GLYCOL 3350 17 GM/SCOOP PO POWD: 1.0000 | Freq: Once | ORAL | 0 refills | Status: AC

## 2015-12-17 NOTE — Progress Notes (Signed)
Patient ID: Mary Morris, female   DOB: [birth year], 50 y.o.   MRN: [id number]  Chief Complaint  Patient presents with  . Follow-up    mammogram    HPI Mary Morris is a 50 y.o. female who presents for a breast evaluation. The most recent mammogram was done on 12/09/2015.  Patient does perform regular self breast checks and gets regular mammograms done.  No new breast issues. Discuss a colonoscopy. No GI issues at this time. Occasional spot bleeding from hemorrhoids. I have reviewed the history of present illness with the patient.  HPI  Past Medical History:  Diagnosis Date  . Breast screening, unspecified   . Depression 2005  . Diffuse cystic mastopathy   . Gallstone 08-12-12  . Obesity, unspecified   . Screening for obesity     Past Surgical History:  Procedure Laterality Date  . BREAST BIOPSY Bilateral    1-rt-neg, 2-left- neg  . BREAST MASS EXCISION Left around 2008  . BREAST MASS EXCISION Right 2007  . CESAREAN SECTION  1998 and 2001  . CHOLECYSTECTOMY  2014  . TUBAL LIGATION  2001    Family History  Problem Relation Age of Onset  . Colon polyps Mother   . Breast cancer Neg Hx     Social History Social History  Substance Use Topics  . Smoking status: Never Smoker  . Smokeless tobacco: Never Used  . Alcohol use No    No Known Allergies  No current outpatient prescriptions on file.   No current facility-administered medications for this visit.     Review of Systems Review of Systems  Constitutional: Negative.   Respiratory: Negative.   Cardiovascular: Negative.   Gastrointestinal: Negative.     Blood pressure 132/82, pulse 88, resp. rate 14, height 4\' 11"  (1.499 m), weight 182 lb (82.6 kg), last menstrual period 10/27/2015.  Physical Exam Physical Exam  Constitutional: She is oriented to person, place, and time. She appears well-developed and well-nourished.  Eyes: Conjunctivae are normal. No scleral icterus.  Neck: Neck supple.   Cardiovascular: Normal rate, regular rhythm and normal heart sounds.   Pulmonary/Chest: Effort normal and breath sounds normal. Right breast exhibits no inverted nipple, no mass, no nipple discharge, no skin change and no tenderness. Left breast exhibits no inverted nipple, no mass, no nipple discharge, no skin change and no tenderness.  Abdominal: Soft. Bowel sounds are normal. There is no tenderness.  Lymphadenopathy:    She has no cervical adenopathy.    She has no axillary adenopathy.  Neurological: She is alert and oriented to person, place, and time.  Skin: Skin is warm and dry.  Psychiatric: Her behavior is normal.    Data Reviewed Mammogram reviewed   Assessment    Stable physical exam. Fibrocystic disease by previous biopsies  Need screening colonoscopy Family history of colon polyps (mother)  Plan   Patient is agreeable to colonoscopy.   Colonoscopy with possible biopsy/polypectomy prn: Information regarding the procedure, including its potential risks and complications (including but not limited to perforation of the bowel, which may require emergency surgery to repair, and bleeding) was verbally given to the patient. Educational information regarding lower intestinal endoscopy was given to the patient. Written instructions for how to complete the bowel prep using Miralax were provided. The importance of drinking ample fluids to avoid dehydration as a result of the prep emphasized.    Patient will be asked to return to the office in one year with a bilateral screening mammogram  This information has been scribed by Dorathy Daft RN, BSN,BC.  SANKAR,SEEPLAPUTHUR G 12/17/2015, 5:01 PM

## 2015-12-17 NOTE — Patient Instructions (Addendum)
Patient will be asked to return to the office in one year with a bilateral screening mammogram  Colonoscopy A colonoscopy is an exam to look at the entire large intestine (colon). This exam can help find problems such as tumors, polyps, inflammation, and areas of bleeding. The exam takes about 1 hour.  LET [facility] CARE PROVIDER KNOW ABOUT:   Any allergies you have.  All medicines you are taking, including vitamins, herbs, eye drops, creams, and over-the-counter medicines.  Previous problems you or members of your family have had with the use of anesthetics.  Any blood disorders you have.  Previous surgeries you have had.  Medical conditions you have. RISKS AND COMPLICATIONS  Generally, this is a safe procedure. However, as with any procedure, complications can occur. Possible complications include:  Bleeding.  Tearing or rupture of the colon wall.  Reaction to medicines given during the exam.  Infection (rare). BEFORE THE PROCEDURE   Ask your health care provider about changing or stopping your regular medicines.  You may be prescribed an oral bowel prep. This involves drinking a large amount of medicated liquid, starting the day before your procedure. The liquid will cause you to have multiple loose stools until your stool is almost clear or light green. This cleans out your colon in preparation for the procedure.  Do not eat or drink anything else once you have started the bowel prep, unless your health care provider tells you it is safe to do so.  Arrange for someone to drive you home after the procedure. PROCEDURE   You will be given medicine to help you relax (sedative).  You will lie on your side with your knees bent.  A long, flexible tube with a light and camera on the end (colonoscope) will be inserted through the rectum and into the colon. The camera sends video back to a computer screen as it moves through the colon. The colonoscope also releases carbon  dioxide gas to inflate the colon. This helps your health care provider see the area better.  During the exam, your health care provider may take a small tissue sample (biopsy) to be examined under a microscope if any abnormalities are found.  The exam is finished when the entire colon has been viewed. AFTER THE PROCEDURE   Do not drive for 24 hours after the exam.  You may have a small amount of blood in your stool.  You may pass moderate amounts of gas and have mild abdominal cramping or bloating. This is caused by the gas used to inflate your colon during the exam.  Ask when your test results will be ready and how you will get your results. Make sure you get your test results.   This information is not intended to replace advice given to you by your health care provider. Make sure you discuss any questions you have with your health care provider.   Document Released: 05/01/2000 Document Revised: 02/22/2013 Document Reviewed: 01/09/2013 Elsevier Interactive Patient Education [employer]  The patient is scheduled for a Colonoscopy at [facility] on 01/27/16. They are aware to call the day before to get their arrival time. Miralax prescription has been sent into the patient's pharmacy. The patient is aware of date and instructions.

## 2016-01-22 ENCOUNTER — Other Ambulatory Visit: Payer: Self-pay | Admitting: General Surgery

## 2016-01-27 ENCOUNTER — Ambulatory Visit: Payer: Managed Care, Other (non HMO) | Admitting: Registered Nurse

## 2016-01-27 ENCOUNTER — Ambulatory Visit
Admission: RE | Admit: 2016-01-27 | Discharge: 2016-01-27 | Disposition: A | Discharge status: Home Health | Payer: Managed Care, Other (non HMO) | Source: Ambulatory Visit | Attending: General Surgery | Admitting: General Surgery

## 2016-01-27 ENCOUNTER — Encounter
Admission: RE | Disposition: A | Discharge status: Home Health | Payer: Self-pay | Source: Ambulatory Visit | Attending: General Surgery

## 2016-01-27 ENCOUNTER — Encounter: Payer: Self-pay | Admitting: *Deleted

## 2016-01-27 DIAGNOSIS — Z9049 Acquired absence of other specified parts of digestive tract: Secondary | ICD-10-CM | POA: Diagnosis not present

## 2016-01-27 DIAGNOSIS — Z9889 Other specified postprocedural states: Secondary | ICD-10-CM | POA: Diagnosis not present

## 2016-01-27 DIAGNOSIS — Z1211 Encounter for screening for malignant neoplasm of colon: Secondary | ICD-10-CM | POA: Diagnosis not present

## 2016-01-27 DIAGNOSIS — E669 Obesity, unspecified: Secondary | ICD-10-CM | POA: Diagnosis not present

## 2016-01-27 DIAGNOSIS — F329 Major depressive disorder, single episode, unspecified: Secondary | ICD-10-CM | POA: Insufficient documentation

## 2016-01-27 DIAGNOSIS — Z6836 Body mass index (BMI) 36.0-36.9, adult: Secondary | ICD-10-CM | POA: Insufficient documentation

## 2016-01-27 DIAGNOSIS — Z8371 Family history of colonic polyps: Secondary | ICD-10-CM | POA: Insufficient documentation

## 2016-01-27 DIAGNOSIS — Z9851 Tubal ligation status: Secondary | ICD-10-CM | POA: Insufficient documentation

## 2016-01-27 HISTORY — DX: Other specified postprocedural states: Z98.890

## 2016-01-27 HISTORY — DX: Adverse effect of unspecified anesthetic, initial encounter: T41.45XA

## 2016-01-27 HISTORY — PX: COLONOSCOPY WITH PROPOFOL: SHX5780

## 2016-01-27 HISTORY — DX: Other complications of anesthesia, initial encounter: T88.59XA

## 2016-01-27 HISTORY — DX: Nausea with vomiting, unspecified: R11.2

## 2016-01-27 LAB — POCT PREGNANCY, URINE: Preg Test, Ur: NEGATIVE

## 2016-01-27 SURGERY — COLONOSCOPY WITH PROPOFOL
Anesthesia: General

## 2016-01-27 MED ORDER — SODIUM CHLORIDE 0.9 % IV SOLN
INTRAVENOUS | Status: DC
  Administered 2016-01-27: 11:00:00 via INTRAVENOUS

## 2016-01-27 MED ORDER — FENTANYL CITRATE (PF) 100 MCG/2ML IJ SOLN
INTRAMUSCULAR | Status: DC | PRN
  Administered 2016-01-27: 50 ug via INTRAVENOUS

## 2016-01-27 MED ORDER — MIDAZOLAM HCL 2 MG/2ML IJ SOLN
INTRAMUSCULAR | Status: DC | PRN
  Administered 2016-01-27: 2 mg via INTRAVENOUS

## 2016-01-27 MED ORDER — PROPOFOL 10 MG/ML IV BOLUS
INTRAVENOUS | Status: DC | PRN
  Administered 2016-01-27: 30 mg via INTRAVENOUS
  Administered 2016-01-27: 40 mg via INTRAVENOUS
  Administered 2016-01-27: 50 mg via INTRAVENOUS
  Administered 2016-01-27: 30 mg via INTRAVENOUS

## 2016-01-27 MED ORDER — LIDOCAINE HCL (CARDIAC) 20 MG/ML IV SOLN
INTRAVENOUS | Status: DC | PRN
  Administered 2016-01-27: 40 mg via INTRAVENOUS

## 2016-01-27 MED ORDER — PROPOFOL 500 MG/50ML IV EMUL
INTRAVENOUS | Status: DC | PRN
  Administered 2016-01-27: 200 ug/kg/min via INTRAVENOUS

## 2016-01-27 NOTE — Anesthesia Procedure Notes (Signed)
Date/Time: 01/27/2016 11:15 AM Performed by: Stormy FabianURTIS, Ronell Duffus Pre-anesthesia Checklist: Patient identified, Emergency Drugs available, Suction available and Patient being monitored Patient Re-evaluated:Patient Re-evaluated prior to inductionOxygen Delivery Method: Nasal cannula Intubation Type: IV induction Dental Injury: Teeth and Oropharynx as per pre-operative assessment  Comments: Nasal cannula with etCO2 monitoring

## 2016-01-27 NOTE — Anesthesia Preprocedure Evaluation (Signed)
Anesthesia Evaluation  Patient identified by MRN, date of birth, ID band Patient awake    Reviewed: Allergy & Precautions, H&P , NPO status , Patient's Chart, lab work & pertinent test results, reviewed documented beta blocker date and time   History of Anesthesia Complications (+) PONV and history of anesthetic complications  Airway Mallampati: II   Neck ROM: full    Dental  (+) Poor Dentition   Pulmonary neg pulmonary ROS,    Pulmonary exam normal        Cardiovascular negative cardio ROS Normal cardiovascular exam Rhythm:regular Rate:Normal     Neuro/Psych PSYCHIATRIC DISORDERS negative neurological ROS  negative psych ROS   GI/Hepatic negative GI ROS, Neg liver ROS,   Endo/Other  negative endocrine ROS  Renal/GU negative Renal ROS  negative genitourinary   Musculoskeletal   Abdominal   Peds  Hematology negative hematology ROS (+)   Anesthesia Other Findings Past Medical History: No date: Breast screening, unspecified No date: Complication of anesthesia 2005: Depression No date: Diffuse cystic mastopathy 08-12-12: Gallstone No date: Obesity, unspecified No date: PONV (postoperative nausea and vomiting) No date: Screening for obesity Past Surgical History: No date: BREAST BIOPSY Bilateral     Comment: 1-rt-neg, 2-left- neg around 2008: BREAST MASS EXCISION Left 2007: BREAST MASS EXCISION Right 1998 and 2001: CESAREAN SECTION 2014: CHOLECYSTECTOMY 2001: TUBAL LIGATION BMI    Body Mass Index:  36.36 kg/m     Reproductive/Obstetrics                             Anesthesia Physical Anesthesia Plan  ASA: III  Anesthesia Plan: General   Post-op Pain Management:    Induction:   Airway Management Planned:   Additional Equipment:   Intra-op Plan:   Post-operative Plan:   Informed Consent: I have reviewed the patients History and Physical, chart, labs and discussed the  procedure including the risks, benefits and alternatives for the proposed anesthesia with the patient or authorized representative who has indicated his/her understanding and acceptance.   Dental Advisory Given  Plan Discussed with: CRNA  Anesthesia Plan Comments:         Anesthesia Quick Evaluation

## 2016-01-27 NOTE — Transfer of Care (Signed)
Immediate Anesthesia Transfer of Care Note  Patient: Mary Morris  Procedure(s) Performed: Procedure(s): COLONOSCOPY WITH PROPOFOL (N/A)  Patient Location: PACU and Endoscopy Unit  Anesthesia Type:General  Level of Consciousness: sedated  Airway & Oxygen Therapy: Patient Spontanous Breathing and Patient connected to nasal cannula oxygen  Post-op Assessment: Report given to RN and Post -op Vital signs reviewed and stable  Post vital signs: Reviewed and stable  Last Vitals:  Vitals:   01/27/16 1016 01/27/16 1143  BP: 131/79 (!) 96/40  Pulse: 66 75  Resp: (!) 22 17  Temp: 37.1 C (!) 35.7 C    Complications: No apparent anesthesia complications

## 2016-01-27 NOTE — Anesthesia Postprocedure Evaluation (Signed)
Anesthesia Post Note  Patient: Mary Morris  Procedure(s) Performed: Procedure(s) (LRB): COLONOSCOPY WITH PROPOFOL (N/A)  Patient location during evaluation: PACU Anesthesia Type: General Level of consciousness: awake and alert Pain management: pain level controlled Vital Signs Assessment: post-procedure vital signs reviewed and stable Respiratory status: spontaneous breathing, nonlabored ventilation, respiratory function stable and patient connected to nasal cannula oxygen Cardiovascular status: blood pressure returned to baseline and stable Postop Assessment: no signs of nausea or vomiting Anesthetic complications: no    Last Vitals:  Vitals:   01/27/16 1203 01/27/16 1213  BP: (!) 100/53 (!) 93/52  Pulse: 62 (!) 57  Resp: 16 12  Temp:      Last Pain:  Vitals:   01/27/16 1143  TempSrc: Tympanic                 [name]

## 2016-01-27 NOTE — Op Note (Signed)
[facility] Gastroenterology Patient Name: Mary Morris Procedure Date: 01/27/2016 11:15 AM MRN: [id number] Account #: [id number] Date of Birth: 1965/07/24 Admit Type: Outpatient Age: 50 Room: [facility] ENDO ROOM 1 Gender: Female Note Status: Finalized Procedure:            Colonoscopy Indications:          Screening for colorectal malignant neoplasm, Colon                        cancer screening in patient at increased risk: Family                        history of 1st-degree relative with colon polyps Providers:            Seeplaputhur G. Evette Cristal, MD Referring MD:         Barbette Reichmann, MD (Referring MD) Medicines:            General Anesthesia Complications:        No immediate complications. Procedure:            Pre-Anesthesia Assessment:                       - Using IV propofol under the supervision of an                        anesthesiologist was determined to be medically                        necessary for this procedure based on review of the                        patient's medical history, medications, and prior                        anesthesia history.                       After obtaining informed consent, the colonoscope was                        passed under direct vision. Throughout the procedure,                        the patient's blood pressure, pulse, and oxygen                        saturations were monitored continuously. The                        Colonoscope was introduced through the anus and                        advanced to the the cecum, identified by the ileocecal                        valve. The colonoscopy was performed without                        difficulty. The patient tolerated the procedure well.  The quality of the bowel preparation was good. Findings:      The perianal and digital rectal examinations were normal.      The entire examined colon appeared normal on direct and retroflexion   views. Impression:           - The entire examined colon is normal on direct and                        retroflexion views.                       - No specimens collected. Recommendation:       - Discharge patient to home.                       - Resume regular diet.                       - Repeat colonoscopy in 5 years for surveillance. Procedure Code(s):    --- Professional ---                       [id number], Colonoscopy, flexible; diagnostic, including                        collection of specimen(s) by brushing or washing, when                        performed (separate procedure) Diagnosis Code(s):    --- Professional ---                       Z12.11, Encounter for screening for malignant neoplasm                        of colon                       Z83.71, Family history of colonic polyps CPT copyright 2016 American Medical Association. All rights reserved. The codes documented in this report are preliminary and upon coder review may  be revised to meet current compliance requirements. Kieth BrightlySeeplaputhur G Sankar, MD 01/27/2016 11:40:49 AM This report has been signed electronically. Number of Addenda: 0 Note Initiated On: 01/27/2016 11:15 AM Scope Withdrawal Time: 0 hours 3 minutes 37 seconds  Total Procedure Duration: 0 hours 17 minutes 0 seconds       [facility]

## 2016-01-27 NOTE — Interval H&P Note (Signed)
History and Physical Interval Note:  01/27/2016 11:15 AM  Mary Morris  has presented today for surgery, with the diagnosis of SCREENING  The various methods of treatment have been discussed with the patient and family. After consideration of risks, benefits and other options for treatment, the patient has consented to  Procedure(s): COLONOSCOPY WITH PROPOFOL (N/A) as a surgical intervention .  The patient's history has been reviewed, patient examined, no change in status, stable for surgery.  I have reviewed the patient's chart and labs.  Questions were answered to the patient's satisfaction.     Aubri Gathright G

## 2016-01-27 NOTE — H&P (Signed)
Mary Morris is an 50 y.o. female.   Chief Complaint:Here for screening colonoscopy HPI: 50 yr old  Female , needing screening colonoscopy. Her mother had coon polyps  Past Medical History:  Diagnosis Date  . Breast screening, unspecified   . Complication of anesthesia   . Depression 2005  . Diffuse cystic mastopathy   . Gallstone 08-12-12  . Obesity, unspecified   . PONV (postoperative nausea and vomiting)   . Screening for obesity     Past Surgical History:  Procedure Laterality Date  . BREAST BIOPSY Bilateral    1-rt-neg, 2-left- neg  . BREAST MASS EXCISION Left around 2008  . BREAST MASS EXCISION Right 2007  . CESAREAN SECTION  1998 and 2001  . CHOLECYSTECTOMY  2014  . TUBAL LIGATION  2001    Family History  Problem Relation Age of Onset  . Colon polyps Mother   . Breast cancer Neg Hx    Social History:  reports that she has never smoked. She has never used smokeless tobacco. She reports that she does not drink alcohol or use drugs.  Allergies: No Known Allergies  No prescriptions prior to admission.    Results for orders placed or performed during the hospital encounter of 01/27/16 (from the past 48 hour(s))  Pregnancy, urine POC     Status: None   Collection Time: 01/27/16 10:19 AM  Result Value Ref Range   Preg Test, Ur NEGATIVE NEGATIVE    Comment:        THE SENSITIVITY OF THIS METHODOLOGY IS >24 mIU/mL    No results found.  Review of Systems  Constitutional: Negative.   Respiratory: Negative.   Cardiovascular: Negative.   Gastrointestinal: Negative.   Genitourinary: Negative.     Blood pressure 131/79, pulse 66, temperature 98.7 F (37.1 C), temperature source Tympanic, resp. rate (!) 22, height 4\' 11"  (1.499 m), weight 180 lb (81.6 kg), SpO2 100 %. Physical Exam  Constitutional: She is oriented to person, place, and time. She appears well-developed and well-nourished.  Eyes: Conjunctivae are normal. Scleral icterus is present.  Neck: Neck  supple.  Cardiovascular: Normal rate, regular rhythm and normal heart sounds.   Respiratory: Effort normal and breath sounds normal.  GI: Soft. Bowel sounds are normal. She exhibits no distension and no mass.  Lymphadenopathy:    She has no cervical adenopathy.  Neurological: She is alert and oriented to person, place, and time.  Skin: Skin is warm and dry.     Assessment/Plan Proceed with colonoscopy as planned.  Kieth BrightlySANKAR,SEEPLAPUTHUR G, MD 01/27/2016, 11:13 AM

## 2016-01-28 ENCOUNTER — Encounter: Payer: Self-pay | Admitting: General Surgery

## 2016-05-14 ENCOUNTER — Other Ambulatory Visit: Payer: Self-pay | Admitting: Internal Medicine

## 2016-05-14 DIAGNOSIS — R748 Abnormal levels of other serum enzymes: Secondary | ICD-10-CM

## 2016-09-14 ENCOUNTER — Other Ambulatory Visit: Payer: Self-pay

## 2016-09-14 DIAGNOSIS — Z1231 Encounter for screening mammogram for malignant neoplasm of breast: Secondary | ICD-10-CM

## 2016-12-15 ENCOUNTER — Ambulatory Visit: Payer: 59

## 2016-12-21 ENCOUNTER — Ambulatory Visit: Payer: 59 | Admitting: General Surgery

## 2017-01-14 ENCOUNTER — Telehealth: Payer: Self-pay | Admitting: General Surgery

## 2017-01-14 NOTE — Telephone Encounter (Signed)
PATIENT CALLED STATING SHE HAD RESCHEDULED HER MAMMO FROM 01-25-17 TO 06-09-16.CURRENTLY HAS AN APPOINTMENT WITH DR Evette CristalSANKAR ON 02-04-17.WOULD YOU LIKE TO SEE HER BEFORE THE END OF THE YEAR WITHOUT HER MAMMO OR FOLLOW UP IN January WITH DR Lemar LivingsBYRNETT OR HER PCP? PLEASE CALL PATIENT [phone] OR [phone]

## 2017-01-21 ENCOUNTER — Telehealth: Payer: Self-pay | Admitting: *Deleted

## 2017-01-21 NOTE — Telephone Encounter (Signed)
Patient was contacted today and made aware that Dr. Evette CristalSankar would like to see her at her scheduled appointment on 02-04-17 even though she has not had her mammogram.  This patient states that she needs to check her schedule and see if she can come.   Patient aware that if she needs to change the date that we will do so to work around her schedule but Dr. Evette CristalSankar would like to see her before he retires.   The patient will let call back and let us know how she would like to proceed.

## 2017-01-25 ENCOUNTER — Ambulatory Visit: Payer: 59

## 2017-02-04 ENCOUNTER — Ambulatory Visit: Payer: 59 | Admitting: General Surgery

## 2017-06-09 ENCOUNTER — Ambulatory Visit
Admission: RE | Admit: 2017-06-09 | Discharge: 2017-06-09 | Disposition: A | Discharge status: Home / Self Care | Payer: 59 | Source: Ambulatory Visit | Attending: General Surgery | Admitting: General Surgery

## 2017-06-09 ENCOUNTER — Other Ambulatory Visit: Payer: Self-pay | Admitting: General Surgery

## 2017-06-09 ENCOUNTER — Encounter (INDEPENDENT_AMBULATORY_CARE_PROVIDER_SITE_OTHER): Payer: Self-pay

## 2017-06-09 DIAGNOSIS — Z1231 Encounter for screening mammogram for malignant neoplasm of breast: Secondary | ICD-10-CM

## 2017-06-16 ENCOUNTER — Ambulatory Visit: Payer: 59 | Admitting: General Surgery

## 2017-06-24 ENCOUNTER — Ambulatory Visit (INDEPENDENT_AMBULATORY_CARE_PROVIDER_SITE_OTHER): Payer: 59 | Admitting: General Surgery

## 2017-06-24 ENCOUNTER — Encounter: Payer: Self-pay | Admitting: General Surgery

## 2017-06-24 VITALS — BP 132/84 | HR 82 | Resp 14 | Ht 59.0 in | Wt 193.0 lb

## 2017-06-24 DIAGNOSIS — N6012 Diffuse cystic mastopathy of left breast: Secondary | ICD-10-CM

## 2017-06-24 DIAGNOSIS — N6011 Diffuse cystic mastopathy of right breast: Secondary | ICD-10-CM

## 2017-06-24 NOTE — Patient Instructions (Signed)
Patient will be asked to return to the office in one year with a bilateral screening mammogram. The patient is aware to call back for any questions or concerns. 

## 2017-06-24 NOTE — Progress Notes (Signed)
Patient ID: Mary Morris, female   DOB: September 08, 1965, 52 y.o.   MRN: [id number]  Chief Complaint  Patient presents with  . Follow-up    HPI Mary Morris is a 52 y.o. female who presents for a breast evaluation. The most recent mammogram was done on 06/09/2017.  Patient does perform regular self breast checks and gets regular mammograms done.    HPI  Past Medical History:  Diagnosis Date  . Breast screening, unspecified   . Complication of anesthesia   . Depression 2005  . Diffuse cystic mastopathy   . Gallstone 08-12-12  . Obesity, unspecified   . PONV (postoperative nausea and vomiting)   . Screening for obesity     Past Surgical History:  Procedure Laterality Date  . BREAST BIOPSY Bilateral    1-rt-neg, 2-left- neg  . BREAST MASS EXCISION Left around 2008  . BREAST MASS EXCISION Right 2007  . CESAREAN SECTION  1998 and 2001  . CHOLECYSTECTOMY  2014  . COLONOSCOPY WITH PROPOFOL N/A 01/27/2016   Procedure: COLONOSCOPY WITH PROPOFOL;  Surgeon: Kieth BrightlySeeplaputhur G Sankar, MD;  Location: [facility];  Service: Endoscopy;  Laterality: N/A;  . TUBAL LIGATION  2001    Family History  Problem Relation Age of Onset  . Colon polyps Mother   . Breast cancer Neg Hx     Social History Social History   Tobacco Use  . Smoking status: Never Smoker  . Smokeless tobacco: Never Used  Substance Use Topics  . Alcohol use: No  . Drug use: No    No Known Allergies  Current Outpatient Medications  Medication Sig Dispense Refill  . aspirin 325 MG tablet Take 325 mg by mouth as needed.     . Multiple Vitamin (MULTIVITAMIN) tablet Take 1 tablet by mouth daily.    . Vitamin D, Ergocalciferol, (DRISDOL) 50000 units CAPS capsule Take by mouth.     No current facility-administered medications for this visit.     Review of Systems Review of Systems  Constitutional: Negative.   Respiratory: Negative.   Cardiovascular: Negative.     Blood pressure 132/84, pulse 82, resp. rate 14,  height 4\' 11"  (1.499 m), weight 193 lb (87.5 kg), last menstrual period 10/27/2015.  Physical Exam Physical Exam  Constitutional: She appears well-developed and well-nourished.  Eyes: Conjunctivae are normal. No scleral icterus.  Neck: Neck supple.  Cardiovascular: Normal rate, regular rhythm and normal heart sounds.  Pulmonary/Chest: Effort normal and breath sounds normal. Right breast exhibits no inverted nipple, no mass, no nipple discharge, no skin change and no tenderness. Left breast exhibits no inverted nipple, no mass, no nipple discharge, no skin change and no tenderness.  Lymphadenopathy:    She has no cervical adenopathy.    She has no axillary adenopathy.  Neurological: She is alert.  Skin: Skin is warm and dry.    Data Reviewed June 09, 2017 screening mammogram is reviewed.  Essentially fatty replaced breast.  No area of concern.  BI-RADS-1.  Assessment    Benign breast exam.    Plan  The patient has expressed a desire to have a follow-up exam through this office in 1 year.  Patient will be asked to return to the office in one year with a bilateral screening mammogram. The patient is aware to call back for any questions or concerns.   HPI, Physical Exam, Assessment and Plan have been scribed under the direction and in the presence of Donnalee CurryJeffrey Byrnett, MD.  Ples SpecterJessica Qualls, CMA  I have completed the exam and reviewed the above documentation for accuracy and completeness.  I agree with the above.  Museum/gallery conservator has been used and any errors in dictation or transcription are unintentional.  Donnalee Curry, M.D., F.A.C.S.  Merrily Pew Byrnett 06/25/2017, 4:22 PM

## 2017-08-10 ENCOUNTER — Encounter: Payer: Self-pay | Admitting: Emergency Medicine

## 2017-08-10 ENCOUNTER — Ambulatory Visit
Admission: EM | Admit: 2017-08-10 | Discharge: 2017-08-10 | Disposition: A | Discharge status: Home / Self Care | Payer: 59 | Attending: Emergency Medicine | Admitting: Emergency Medicine

## 2017-08-10 ENCOUNTER — Other Ambulatory Visit: Payer: Self-pay

## 2017-08-10 DIAGNOSIS — R319 Hematuria, unspecified: Secondary | ICD-10-CM

## 2017-08-10 DIAGNOSIS — R3915 Urgency of urination: Secondary | ICD-10-CM

## 2017-08-10 DIAGNOSIS — R310 Gross hematuria: Secondary | ICD-10-CM | POA: Diagnosis not present

## 2017-08-10 DIAGNOSIS — N390 Urinary tract infection, site not specified: Secondary | ICD-10-CM | POA: Diagnosis not present

## 2017-08-10 LAB — URINALYSIS, COMPLETE (UACMP) WITH MICROSCOPIC
Glucose, UA: NEGATIVE mg/dL
KETONES UR: 15 mg/dL — AB
NITRITE: NEGATIVE
Specific Gravity, Urine: 1.02 (ref 1.005–1.030)
pH: 5.5 (ref 5.0–8.0)

## 2017-08-10 MED ORDER — CEPHALEXIN 500 MG PO CAPS: 500.0000 mg | ORAL_CAPSULE | Freq: Two times a day (BID) | ORAL | 0 refills | Status: DC

## 2017-08-10 MED ORDER — PHENAZOPYRIDINE HCL 200 MG PO TABS: 200.0000 mg | ORAL_TABLET | Freq: Three times a day (TID) | ORAL | 0 refills | Status: DC | PRN

## 2017-08-10 NOTE — ED Triage Notes (Signed)
Patient in today c/o hematuria since yesterday. Patient denies urinary frequency or pain.

## 2017-08-10 NOTE — ED Provider Notes (Signed)
HPI  SUBJECTIVE:  Mary Morris is a 52 y.o. female who presents with gross hematuria starting yesterday.  States it was light pink yesterday.  She reports urinary urgency, lower abdominal pressure before urinating, cloudy urine.  She denies dysuria, frequency, odorous urine.  No fevers, nausea, vomiting, back or pelvic pain.  She denies vaginal bleeding, thought that she may be starting her period so she inserted a tampon, but when she took it out it was clean.  She denies vaginal odor, discharge, rash, itching.  No melena.  No rectal bleeding.  No aggravating or alleviating factors.  She has not tried anything for this.  Past medical history negative for diabetes, hypertension, UTI, pyelonephritis, nephrolithiasis, smoking, cancer.  No regular anticoagulant or antiplatelet use.  States that she takes aspirin intermittently for headaches, last dose was 2 days ago.  LMP: 1.5 years ago.  Family history negative for nephrolithiasis, bladder cancer.  PMD: Barbette Reichmann, MD   Past Medical History:  Diagnosis Date  . Breast screening, unspecified   . Complication of anesthesia   . Depression 2005  . Diffuse cystic mastopathy   . Gallstone 08-12-12  . Obesity, unspecified   . PONV (postoperative nausea and vomiting)   . Screening for obesity     Past Surgical History:  Procedure Laterality Date  . BREAST BIOPSY Bilateral    1-rt-neg, 2-left- neg  . BREAST MASS EXCISION Left around 2008  . BREAST MASS EXCISION Right 2007  . CESAREAN SECTION  1998 and 2001  . CHOLECYSTECTOMY  2014  . COLONOSCOPY WITH PROPOFOL N/A 01/27/2016   Procedure: COLONOSCOPY WITH PROPOFOL;  Surgeon: Kieth Brightly, MD;  Location: [facility];  Service: Endoscopy;  Laterality: N/A;  . TUBAL LIGATION  2001    Family History  Problem Relation Age of Onset  . Colon polyps Mother   . Breast cancer Neg Hx     Social History   Tobacco Use  . Smoking status: Never Smoker  . Smokeless tobacco: Never Used   Substance Use Topics  . Alcohol use: No  . Drug use: No    No current facility-administered medications for this encounter.   Current Outpatient Medications:  Marland Kitchen  Multiple Vitamin (MULTIVITAMIN) tablet, Take 1 tablet by mouth daily., Disp: , Rfl:  .  cephALEXin (KEFLEX) 500 MG capsule, Take 1 capsule (500 mg total) by mouth 2 (two) times daily., Disp: 14 capsule, Rfl: 0 .  phenazopyridine (PYRIDIUM) 200 MG tablet, Take 1 tablet (200 mg total) by mouth 3 (three) times daily as needed for pain., Disp: 6 tablet, Rfl: 0 .  Vitamin D, Ergocalciferol, (DRISDOL) 50000 units CAPS capsule, Take by mouth., Disp: , Rfl:   No Known Allergies   ROS  As noted in HPI.   Physical Exam  BP (!) 144/81 (BP Location: Left Arm)   Pulse 92   Temp 97.9 F (36.6 C) (Oral)   Resp 16   Ht 4\' 11"  (1.499 m)   Wt 185 lb (83.9 kg)   LMP 01/27/2016 Comment: periods are irregular-not preg-had tubal  SpO2 100%   BMI 37.37 kg/m   Constitutional: Well developed, well nourished, no acute distress Eyes:  EOMI, conjunctiva normal bilaterally HENT: Normocephalic, atraumatic,mucus membranes moist Respiratory: Normal inspiratory effort Cardiovascular: Normal rate GI: nondistended, nontender, nondistended.  No suprapubic, flank tenderness. Back: No CVAT skin: No rash, skin intact Musculoskeletal: no deformities Neurologic: Alert & oriented x 3, no focal neuro deficits Psychiatric: Speech and behavior appropriate   ED Course  Medications - No data to display  Orders Placed This Encounter  Procedures  . Urine culture    Standing Status:   Standing    Number of Occurrences:   1    Order Specific Question:   List patient's active antibiotics    Answer:   keflex    Order Specific Question:   Patient immune status    Answer:   Normal  . Urinalysis, Complete w Microscopic    Standing Status:   Standing    Number of Occurrences:   1  . Ambulatory referral to Urology    Referral Priority:   Routine     Referral Type:   Consultation    Referral Reason:   Specialty Services Required    Requested Specialty:   Urology    Number of Visits Requested:   1    Results for orders placed or performed during the hospital encounter of 08/10/17 (from the past 24 hour(s))  Urinalysis, Complete w Microscopic     Status: Abnormal   Collection Time: 08/10/17  7:59 PM  Result Value Ref Range   Color, Urine RED (A) YELLOW   APPearance TURBID (A) CLEAR   Specific Gravity, Urine 1.020 1.005 - 1.030   pH 5.5 5.0 - 8.0   Glucose, UA NEGATIVE NEGATIVE mg/dL   Hgb urine dipstick LARGE (A) NEGATIVE   Bilirubin Urine LARGE (A) NEGATIVE   Ketones, ur 15 (A) NEGATIVE mg/dL   Protein, ur >409>300 (A) NEGATIVE mg/dL   Nitrite NEGATIVE NEGATIVE   Leukocytes, UA LARGE (A) NEGATIVE   Squamous Epithelial / LPF 0-5 (A) NONE SEEN   WBC, UA TOO NUMEROUS TO COUNT 0 - 5 WBC/hpf   RBC / HPF TOO NUMEROUS TO COUNT 0 - 5 RBC/hpf   Bacteria, UA FEW (A) NONE SEEN   No results found.  ED Clinical Impression  Gross hematuria  Urinary tract infection with hematuria, site unspecified   ED Assessment/Plan  UA is suggestive of a UTI.  Will rx Keflex, Pyridium, push fluids, sending urine off for culture to confirm presence of UTI and antibiotic choice.  Also discussed with her the other causes of hematuria such as nephrolithiasis, malignancy.  Will order urology follow-up with Dr. Evelene CroonWolff, urology on call if she is still having hematuria after finishing the antibiotics.  Discussed labs, MDM, plan and followup with patient. Discussed sn/sx that should prompt return to the ED. patient agrees with plan.   Meds ordered this encounter  Medications  . phenazopyridine (PYRIDIUM) 200 MG tablet    Sig: Take 1 tablet (200 mg total) by mouth 3 (three) times daily as needed for pain.    Dispense:  6 tablet    Refill:  0  . cephALEXin (KEFLEX) 500 MG capsule    Sig: Take 1 capsule (500 mg total) by mouth 2 (two) times daily.     Dispense:  14 capsule    Refill:  0    *This clinic note was created using Scientist, clinical (histocompatibility and immunogenetics)Dragon dictation software. Therefore, there may be occasional mistakes despite careful proofreading.   ?   Domenick GongMortenson, Mehr Depaoli, MD 08/10/17 2035

## 2017-08-10 NOTE — Discharge Instructions (Addendum)
Give us a working phone number so that we can contact you if we need to change her antibiotics.  Push fluids.  The Pyridium will help with your urinary urgency and the lower abdominal pressure, and the Keflex is the antibiotic that we will treat a urinary tract infection.

## 2017-08-13 LAB — URINE CULTURE: Special Requests: NORMAL

## 2017-08-23 ENCOUNTER — Ambulatory Visit: Payer: Self-pay | Admitting: Urology

## 2018-03-04 ENCOUNTER — Other Ambulatory Visit: Payer: Self-pay

## 2018-03-04 ENCOUNTER — Ambulatory Visit
Admission: EM | Admit: 2018-03-04 | Discharge: 2018-03-04 | Disposition: A | Discharge status: Home / Self Care | Payer: 59 | Attending: Internal Medicine | Admitting: Internal Medicine

## 2018-03-04 ENCOUNTER — Ambulatory Visit (INDEPENDENT_AMBULATORY_CARE_PROVIDER_SITE_OTHER): Payer: 59

## 2018-03-04 ENCOUNTER — Encounter: Payer: Self-pay | Admitting: Emergency Medicine

## 2018-03-04 DIAGNOSIS — K5289 Other specified noninfective gastroenteritis and colitis: Secondary | ICD-10-CM | POA: Diagnosis not present

## 2018-03-04 DIAGNOSIS — R252 Cramp and spasm: Secondary | ICD-10-CM

## 2018-03-04 DIAGNOSIS — R109 Unspecified abdominal pain: Secondary | ICD-10-CM | POA: Diagnosis not present

## 2018-03-04 DIAGNOSIS — R1010 Upper abdominal pain, unspecified: Secondary | ICD-10-CM

## 2018-03-04 DIAGNOSIS — R1084 Generalized abdominal pain: Secondary | ICD-10-CM | POA: Diagnosis not present

## 2018-03-04 DIAGNOSIS — K529 Noninfective gastroenteritis and colitis, unspecified: Secondary | ICD-10-CM

## 2018-03-04 DIAGNOSIS — R112 Nausea with vomiting, unspecified: Secondary | ICD-10-CM

## 2018-03-04 LAB — URINALYSIS, COMPLETE (UACMP) WITH MICROSCOPIC
GLUCOSE, UA: NEGATIVE mg/dL
Glucose, UA: NEGATIVE mg/dL
Hgb urine dipstick: NEGATIVE
KETONES UR: 15 mg/dL — AB
Ketones, ur: 15 mg/dL — AB
Nitrite: NEGATIVE
Nitrite: NEGATIVE
PH: 5.5 (ref 5.0–8.0)
PROTEIN: 100 mg/dL — AB
Protein, ur: 30 mg/dL — AB
RBC / HPF: NONE SEEN RBC/hpf (ref 0–5)
RBC / HPF: NONE SEEN RBC/hpf (ref 0–5)
SPECIFIC GRAVITY, URINE: 1.025 (ref 1.005–1.030)
Specific Gravity, Urine: >1.03 — ABNORMAL HIGH (ref 1.005–1.030)
Squamous Epithelial / LPF: >50 (ref 0–5)
pH: 5.5 (ref 5.0–8.0)

## 2018-03-04 NOTE — Discharge Instructions (Addendum)
No specific cause for stomach pain identified tonight.  Symptoms seem most likely to be due to a severe stomach bug a few days ago, which is continuing to subside.  Xray did not suggest bowel blockage or perforation.  Urine test did not suggest a UTI; a urine culture is pending.  The urgent care will contact you if additional treatment is needed.  Push fluids and rest.  Anticipate gradual improvement in abdominal discomfort over the next several days.  If cramping/pain increases significantly or is not improving as expected, please go to the ED for further evaluation.

## 2018-03-04 NOTE — ED Provider Notes (Signed)
[facility]    CSN: [id number] Arrival date & time: 03/04/18  1842     History   Chief Complaint Chief Complaint  Patient presents with  . Abdominal Pain    HPI Mary Morris is a 52 y.o. female. She presents today with several days' hx GI symptoms, including severe crampy abdominal pain (improved by 50% since onset on 10/14, but not resolved), diarrhea on 10/15 (no BM since), anorexia.  Dry heaves on 10/15, none since, denies nausea.  No dysuria, no urinary frequency.  No unusual vaginal discharge or bleeding.  Current pain is intermittent, crampy, located in central to upper abdomen.  No fever. Hx chole, no other abd surgeries.  Typical BM pattern is 1 qd/qod, not hard/large/painful.    HPI  Past Medical History:  Diagnosis Date  . Breast screening, unspecified   . Complication of anesthesia   . Depression 2005  . Diffuse cystic mastopathy   . Gallstone 08-12-12  . Obesity, unspecified   . PONV (postoperative nausea and vomiting)   . Screening for obesity     Patient Active Problem List   Diagnosis Date Noted  . Diffuse cystic mastopathy 10/06/2012    Past Surgical History:  Procedure Laterality Date  . BREAST BIOPSY Bilateral    1-rt-neg, 2-left- neg  . BREAST MASS EXCISION Left around 2008  . BREAST MASS EXCISION Right 2007  . CESAREAN SECTION  1998 and 2001  . CHOLECYSTECTOMY  2014  . COLONOSCOPY WITH PROPOFOL N/A 01/27/2016   Procedure: COLONOSCOPY WITH PROPOFOL;  Surgeon: Kieth Brightly, MD;  Location: [facility];  Service: Endoscopy;  Laterality: N/A;  . TUBAL LIGATION  2001    OB History    Gravida  2   Para  2   Term      Preterm      AB      Living  2     SAB  0   TAB  0   Ectopic      Multiple      Live Births           Obstetric Comments  Age first menstrual cycle 11 Age first pregnancy 30         Home Medications    Prior to Admission medications   Medication Sig Start Date End Date Taking?  Authorizing Provider  Multiple Vitamin (MULTIVITAMIN) tablet Take 1 tablet by mouth daily.    [provider]  Vitamin D, Ergocalciferol, (DRISDOL) 50000 units CAPS capsule Take by mouth. 01/02/16   [provider]    Family History Family History  Problem Relation Age of Onset  . Colon polyps Mother   . Breast cancer Neg Hx     Social History Social History   Tobacco Use  . Smoking status: Never Smoker  . Smokeless tobacco: Never Used  Substance Use Topics  . Alcohol use: No  . Drug use: No     Allergies   Patient has no known allergies.   Review of Systems Review of Systems  All other systems reviewed and are negative.    Physical Exam Triage Vital Signs ED Triage Vitals [03/04/18 1850]  Enc Vitals Group     BP (!) 155/90     Pulse Rate (!) 115     Resp 18     Temp 98.5 F (36.9 C)     Temp Source Oral     SpO2 98 %     Weight 185 lb (83.9  kg)     Height 4\' 11"  (1.499 m)     Pain Score 4     Pain Loc    Orthostatic VS for the past 24 hrs:  BP- Lying Pulse- Lying BP- Sitting Pulse- Sitting  03/04/18 1905 145/70 103 139/83 105    Updated Vital Signs BP (!) 155/90 (BP Location: Left Arm)   Pulse 99   Temp 98.5 F (36.9 C) (Oral)   Resp 18   Ht 4\' 11"  (1.499 m)   Wt 83.9 kg   LMP 01/27/2016 Comment: periods are irregular-not preg-had tubal  SpO2 98%   BMI 37.37 kg/m  Physical Exam  Constitutional: She is oriented to person, place, and time. No distress.  Alert, nicely groomed  HENT:  Head: Atraumatic.  Eyes:  Conjugate gaze, no eye redness/drainage  Neck: Neck supple.  Cardiovascular: Regular rhythm.  HR 110s  Pulmonary/Chest: No respiratory distress. She has no wheezes. She has no rales.  Lungs clear, symmetric breath sounds  Abdominal: Soft. There is no rebound and no guarding.  ?Maybe slightly distended, diffuse mod tenderness to deep palpation, nonfocal  Musculoskeletal: Normal range of motion.  No leg swelling    Neurological: She is alert and oriented to person, place, and time.  Skin: Skin is warm and dry.  No cyanosis  Nursing note and vitals reviewed.    UC Treatments / Results  Labs Results for orders placed or performed during the hospital encounter of 03/04/18   Results for orders placed or performed during the hospital encounter of 03/04/18  Urinalysis, Complete w Microscopic  Result Value Ref Range   Color, Urine YELLOW YELLOW   APPearance HAZY (A) CLEAR   Specific Gravity, Urine >1.030 (H) 1.005 - 1.030   pH 5.5 5.0 - 8.0   Glucose, UA NEGATIVE NEGATIVE mg/dL   Hgb urine dipstick TRACE (A) NEGATIVE   Bilirubin Urine LARGE (A) NEGATIVE   Ketones, ur 15 (A) NEGATIVE mg/dL   Protein, ur 161 (A) NEGATIVE mg/dL   Nitrite NEGATIVE NEGATIVE   Leukocytes, UA SMALL (A) NEGATIVE   Squamous Epithelial / LPF 11-20 0 - 5   WBC, UA 0-5 0 - 5 WBC/hpf   RBC / HPF NONE SEEN 0 - 5 RBC/hpf   Bacteria, UA FEW (A) NONE SEEN   Urine-Other LESS THAN 10 mL OF URINE SUBMITTED   Urinalysis, Complete w Microscopic  Result Value Ref Range   Color, Urine AMBER (A) YELLOW   APPearance CLOUDY (A) CLEAR   Specific Gravity, Urine 1.025 1.005 - 1.030   pH 5.5 5.0 - 8.0   Glucose, UA NEGATIVE NEGATIVE mg/dL   Hgb urine dipstick NEGATIVE NEGATIVE   Bilirubin Urine LARGE (A) NEGATIVE   Ketones, ur 15 (A) NEGATIVE mg/dL   Protein, ur 30 (A) NEGATIVE mg/dL   Nitrite NEGATIVE NEGATIVE   Leukocytes, UA TRACE (A) NEGATIVE   Squamous Epithelial / LPF >50 0 - 5   WBC, UA 6-10 0 - 5 WBC/hpf   RBC / HPF NONE SEEN 0 - 5 RBC/hpf   Bacteria, UA FEW (A) NONE SEEN   Urine-Other LESS THAN 10 mL OF URINE SUBMITTED    Urine culture pending   Radiology Dg Abd Acute W/chest  Result Date: 03/04/2018 CLINICAL DATA:  Diffuse abdominal pain and cramping with nausea and vomiting. EXAM: DG ABDOMEN ACUTE W/ 1V CHEST COMPARISON:  None. FINDINGS: Heart size and mediastinal contours are normal. Lungs are clear.  Nonobstructive bowel gas pattern. Scattered air fluid levels within  large bowel suspicious for diarrheal disease there are identified. No free air. Cholecystectomy clips are present. No acute osseous abnormality. IMPRESSION: Air-fluid levels seen scattered within large bowel question diarrheal disease. No bowel obstruction identified. Clear lungs. Electronically Signed   By: Tollie Eth M.D.   On: 03/04/2018 19:47     Final Clinical Impressions(s) / UC Diagnoses   Final diagnoses:  Diffuse abdominal pain  Acute gastroenteritis     Discharge Instructions     No specific cause for stomach pain identified tonight.  Symptoms seem most likely to be due to a severe stomach bug a few days ago, which is continuing to subside.  Xray did not suggest bowel blockage or perforation.  Urine test did not suggest a UTI; a urine culture is pending.  The urgent care will contact you if additional treatment is needed.  Push fluids and rest.  Anticipate gradual improvement in abdominal discomfort over the next several days.  If cramping/pain increases significantly or is not improving as expected, please go to the ED for further evaluation.         Isa Rankin, MD 03/05/18 2055

## 2018-03-04 NOTE — ED Triage Notes (Signed)
Patient c/o stomach cramps starting Monday morning. Patient stated this lasted 48 hours and then she started having vomiting and diarrhea x 1 day. Patient states the cramping has gone away but now she just has abdominal pain.

## 2018-03-06 LAB — URINE CULTURE

## 2018-05-17 ENCOUNTER — Other Ambulatory Visit: Payer: Self-pay

## 2018-05-17 DIAGNOSIS — Z1231 Encounter for screening mammogram for malignant neoplasm of breast: Secondary | ICD-10-CM

## 2018-06-13 ENCOUNTER — Ambulatory Visit: Payer: 59

## 2018-06-28 ENCOUNTER — Ambulatory Visit: Payer: Self-pay | Admitting: General Surgery

## 2018-08-18 ENCOUNTER — Ambulatory Visit: Payer: Self-pay

## 2018-09-21 ENCOUNTER — Ambulatory Visit: Payer: Self-pay

## 2018-12-13 ENCOUNTER — Ambulatory Visit: Payer: Self-pay

## 2019-01-19 ENCOUNTER — Encounter: Payer: Self-pay | Admitting: *Deleted

## 2019-02-22 ENCOUNTER — Ambulatory Visit: Payer: Self-pay

## 2019-03-07 ENCOUNTER — Other Ambulatory Visit: Payer: Self-pay | Admitting: Surgery

## 2019-03-07 DIAGNOSIS — Z1231 Encounter for screening mammogram for malignant neoplasm of breast: Secondary | ICD-10-CM

## 2019-03-08 ENCOUNTER — Ambulatory Visit: Payer: Self-pay

## 2019-04-25 ENCOUNTER — Other Ambulatory Visit: Payer: Self-pay | Admitting: General Surgery

## 2019-07-25 ENCOUNTER — Ambulatory Visit: Payer: Self-pay

## 2019-09-20 ENCOUNTER — Ambulatory Visit: Payer: Self-pay

## 2019-10-17 ENCOUNTER — Ambulatory Visit: Payer: Self-pay

## 2019-10-24 ENCOUNTER — Ambulatory Visit: Payer: Self-pay

## 2019-10-31 ENCOUNTER — Other Ambulatory Visit: Payer: Self-pay

## 2019-10-31 ENCOUNTER — Ambulatory Visit
Admission: RE | Admit: 2019-10-31 | Discharge: 2019-10-31 | Disposition: A | Discharge status: Home / Self Care | Payer: BC Managed Care – PPO | Source: Ambulatory Visit | Attending: Surgery | Admitting: Surgery

## 2019-10-31 DIAGNOSIS — Z1231 Encounter for screening mammogram for malignant neoplasm of breast: Secondary | ICD-10-CM | POA: Insufficient documentation

## 2020-07-25 ENCOUNTER — Other Ambulatory Visit: Payer: Self-pay | Admitting: Internal Medicine

## 2020-07-25 ENCOUNTER — Other Ambulatory Visit: Payer: Self-pay | Admitting: Surgery

## 2020-07-25 DIAGNOSIS — Z1231 Encounter for screening mammogram for malignant neoplasm of breast: Secondary | ICD-10-CM

## 2020-10-31 ENCOUNTER — Other Ambulatory Visit: Payer: Self-pay

## 2020-10-31 ENCOUNTER — Ambulatory Visit
Admission: RE | Admit: 2020-10-31 | Discharge: 2020-10-31 | Disposition: A | Discharge status: Home / Self Care | Payer: BC Managed Care – PPO | Source: Ambulatory Visit | Attending: Internal Medicine | Admitting: Internal Medicine

## 2020-10-31 DIAGNOSIS — Z1231 Encounter for screening mammogram for malignant neoplasm of breast: Secondary | ICD-10-CM | POA: Insufficient documentation

## 2020-11-05 ENCOUNTER — Other Ambulatory Visit: Payer: Self-pay | Admitting: Internal Medicine

## 2020-11-05 DIAGNOSIS — N6320 Unspecified lump in the left breast, unspecified quadrant: Secondary | ICD-10-CM

## 2020-11-05 DIAGNOSIS — R928 Other abnormal and inconclusive findings on diagnostic imaging of breast: Secondary | ICD-10-CM

## 2020-11-05 DIAGNOSIS — N6310 Unspecified lump in the right breast, unspecified quadrant: Secondary | ICD-10-CM

## 2020-11-13 ENCOUNTER — Ambulatory Visit
Admission: RE | Admit: 2020-11-13 | Discharge: 2020-11-13 | Disposition: A | Discharge status: Home / Self Care | Payer: BC Managed Care – PPO | Source: Ambulatory Visit | Attending: Internal Medicine | Admitting: Internal Medicine

## 2020-11-13 ENCOUNTER — Other Ambulatory Visit: Payer: Self-pay

## 2020-11-13 DIAGNOSIS — R928 Other abnormal and inconclusive findings on diagnostic imaging of breast: Secondary | ICD-10-CM

## 2020-11-13 DIAGNOSIS — N6320 Unspecified lump in the left breast, unspecified quadrant: Secondary | ICD-10-CM

## 2020-11-13 DIAGNOSIS — N6310 Unspecified lump in the right breast, unspecified quadrant: Secondary | ICD-10-CM | POA: Diagnosis present

## 2020-11-15 ENCOUNTER — Other Ambulatory Visit: Payer: Self-pay | Admitting: Internal Medicine

## 2020-11-15 DIAGNOSIS — N6001 Solitary cyst of right breast: Secondary | ICD-10-CM

## 2020-11-15 DIAGNOSIS — N6002 Solitary cyst of left breast: Secondary | ICD-10-CM

## 2021-05-15 ENCOUNTER — Other Ambulatory Visit: Payer: Self-pay

## 2021-05-15 ENCOUNTER — Ambulatory Visit
Admission: RE | Admit: 2021-05-15 | Discharge: 2021-05-15 | Disposition: A | Discharge status: Home / Self Care | Payer: BC Managed Care – PPO | Source: Ambulatory Visit | Attending: Internal Medicine | Admitting: Internal Medicine

## 2021-05-15 DIAGNOSIS — N6001 Solitary cyst of right breast: Secondary | ICD-10-CM

## 2021-05-15 DIAGNOSIS — N6002 Solitary cyst of left breast: Secondary | ICD-10-CM

## 2021-05-23 ENCOUNTER — Other Ambulatory Visit: Payer: Self-pay | Admitting: Internal Medicine

## 2021-05-23 DIAGNOSIS — N6310 Unspecified lump in the right breast, unspecified quadrant: Secondary | ICD-10-CM

## 2021-05-23 DIAGNOSIS — N6320 Unspecified lump in the left breast, unspecified quadrant: Secondary | ICD-10-CM

## 2021-06-22 ENCOUNTER — Ambulatory Visit: Payer: Self-pay

## 2021-11-03 ENCOUNTER — Ambulatory Visit
Admission: RE | Admit: 2021-11-03 | Discharge: 2021-11-03 | Disposition: A | Discharge status: Home / Self Care | Payer: BC Managed Care – PPO | Source: Ambulatory Visit | Attending: Internal Medicine | Admitting: Internal Medicine

## 2021-11-03 DIAGNOSIS — N6320 Unspecified lump in the left breast, unspecified quadrant: Secondary | ICD-10-CM

## 2021-11-03 DIAGNOSIS — N6310 Unspecified lump in the right breast, unspecified quadrant: Secondary | ICD-10-CM

## 2022-10-21 ENCOUNTER — Encounter: Payer: Self-pay | Admitting: Internal Medicine

## 2022-10-22 ENCOUNTER — Other Ambulatory Visit: Payer: Self-pay | Admitting: Internal Medicine

## 2022-10-22 DIAGNOSIS — N63 Unspecified lump in unspecified breast: Secondary | ICD-10-CM
